# Patient Record
Sex: Female | Born: 1985 | Race: Black or African American | Hispanic: No | Marital: Single | Smoking: Never smoker
Health system: Southern US, Community
[De-identification: ages and names within clinical notes are randomized; demographics above are authoritative.]

## PROBLEM LIST (undated history)

## (undated) ENCOUNTER — Inpatient Hospital Stay (HOSPITAL_COMMUNITY): Payer: Self-pay

## (undated) DIAGNOSIS — O09899 Supervision of other high risk pregnancies, unspecified trimester: Secondary | ICD-10-CM

## (undated) DIAGNOSIS — A749 Chlamydial infection, unspecified: Secondary | ICD-10-CM

## (undated) DIAGNOSIS — N390 Urinary tract infection, site not specified: Secondary | ICD-10-CM

## (undated) DIAGNOSIS — O09219 Supervision of pregnancy with history of pre-term labor, unspecified trimester: Secondary | ICD-10-CM

## (undated) DIAGNOSIS — D649 Anemia, unspecified: Secondary | ICD-10-CM

## (undated) HISTORY — DX: Urinary tract infection, site not specified: N39.0

## (undated) HISTORY — DX: Supervision of other high risk pregnancies, unspecified trimester: O09.899

## (undated) HISTORY — DX: Chlamydial infection, unspecified: A74.9

## (undated) HISTORY — DX: Supervision of pregnancy with history of pre-term labor, unspecified trimester: O09.219

---

## 2011-04-16 ENCOUNTER — Other Ambulatory Visit: Payer: Self-pay

## 2011-04-16 ENCOUNTER — Encounter (HOSPITAL_COMMUNITY): Payer: Self-pay | Admitting: *Deleted

## 2011-04-16 ENCOUNTER — Inpatient Hospital Stay (HOSPITAL_COMMUNITY)
Admission: AD | Admit: 2011-04-16 | Discharge: 2011-04-16 | Disposition: A | Discharge status: Home / Self Care | Payer: Medicaid Other | Source: Ambulatory Visit | Attending: Obstetrics & Gynecology | Admitting: Obstetrics & Gynecology

## 2011-04-16 DIAGNOSIS — O2650 Maternal hypotension syndrome, unspecified trimester: Secondary | ICD-10-CM | POA: Insufficient documentation

## 2011-04-16 DIAGNOSIS — D649 Anemia, unspecified: Secondary | ICD-10-CM | POA: Insufficient documentation

## 2011-04-16 DIAGNOSIS — O99019 Anemia complicating pregnancy, unspecified trimester: Secondary | ICD-10-CM | POA: Insufficient documentation

## 2011-04-16 DIAGNOSIS — R55 Syncope and collapse: Secondary | ICD-10-CM

## 2011-04-16 HISTORY — DX: Anemia, unspecified: D64.9

## 2011-04-16 LAB — URINALYSIS, ROUTINE W REFLEX MICROSCOPIC
Glucose, UA: NEGATIVE mg/dL
Nitrite: NEGATIVE
Specific Gravity, Urine: 1.025 (ref 1.005–1.030)
pH: 7 (ref 5.0–8.0)

## 2011-04-16 LAB — URINE MICROSCOPIC-ADD ON

## 2011-04-16 LAB — CBC
MCH: 29 pg (ref 26.0–34.0)
MCHC: 32.8 g/dL (ref 30.0–36.0)
Platelets: 161 10*3/uL (ref 150–400)
RDW: 14 % (ref 11.5–15.5)

## 2011-04-16 LAB — COMPREHENSIVE METABOLIC PANEL
ALT: 8 U/L (ref 0–35)
Albumin: 3.1 g/dL — ABNORMAL LOW (ref 3.5–5.2)
Calcium: 9.5 mg/dL (ref 8.4–10.5)
GFR calc Af Amer: >90 mL/min (ref >90)
Glucose, Bld: 73 mg/dL (ref 70–99)
Sodium: 131 mEq/L — ABNORMAL LOW (ref 135–145)
Total Protein: 7.1 g/dL (ref 6.0–8.3)

## 2011-04-16 MED ORDER — ACETAMINOPHEN 325 MG PO TABS
650.0000 mg | ORAL_TABLET | Freq: Once | ORAL | Status: AC
  Administered 2011-04-16: 650 mg via ORAL
  Filled 2011-04-16: qty 2

## 2011-04-16 NOTE — Progress Notes (Signed)
Pt brought in by EMS. Pt states she was at work today and passed out witnessed by her Production designer, theatre/television/film. Pt unsure if she hit her head or not. No c/o pain at this time. No PNC.

## 2011-04-16 NOTE — ED Provider Notes (Signed)
History     Chief Complaint  Patient presents with  . Loss of Consciousness   HPI  Pt is pregnant ?[redacted]w[redacted]d pregnant with LMP 12/19/2010.  She saw [facility] for pregnancy verification 02/20/2011- pt is waiting for Medicaid.  She was working at [employer] @ 11 am and at 12 noon, pt felt dizzy and went to the back of Estate agent found pt on the floor and picked pt up- not sure how long she had been unconscious- pt is not sure if she hit her head- frontal/left orbital.  She was brought in by EMS.  She denies any spotting or bleeding or cramping.  Pt states that she has a history of syncope episodes in high school- no noted cause  Past Medical History  Diagnosis Date  . Anemia     Past Surgical History  Procedure Date  . Cesarean section     No family history on file.  History  Substance Use Topics  . Smoking status: Never Smoker   . Smokeless tobacco: Not on file  . Alcohol Use: No    Allergies: Allergies not on file  No prescriptions prior to admission    Review of Systems  Constitutional: Negative for fever and chills.  Eyes: Negative for blurred vision.  Gastrointestinal: Negative for nausea, vomiting, abdominal pain, diarrhea and constipation.  Genitourinary: Negative for dysuria.  Neurological: Positive for dizziness. Negative for tingling and focal weakness.   Physical Exam   Blood pressure 103/61, pulse 100, temperature 97.1 F (36.2 C), temperature source Oral, resp. rate 20, height 5\' 4"  (1.626 m), weight 100 lb (45.36 kg), last menstrual period 12/19/2010.  Physical Exam  Vitals reviewed. Constitutional: She is oriented to person, place, and time. She appears well-developed and well-nourished. No distress.  Eyes: Pupils are equal, round, and reactive to light.       Small edematous closed tender area over left eye- ice applied  Neck: Normal range of motion. Neck supple.  Cardiovascular: Normal rate.   Respiratory: Effort normal.  GI: Soft.    Musculoskeletal: Normal range of motion.  Neurological: She is alert and oriented to person, place, and time.  Skin: Skin is warm and dry.  Psychiatric: She has a normal mood and affect.    MAU Course  Procedures CBC CMET Urinalysis EKG    Assessment and Plan  Syncope episode in pregnancy- discussed eating small frequent meals and staying well hydrated- avoiding sweets- getting up slowly and not standing still for long periods of time Anemia- pt to continue prenatal vitamins and to add supplemental iron F/u with OB care at [facility] 04/16/2011, 1:48 PM

## 2011-04-18 NOTE — ED Provider Notes (Signed)
Attestation of Attending Supervision of Advanced Practitioner: Evaluation and management procedures were performed by the PA/NP/CNM/OB Fellow under my supervision/collaboration. Chart reviewed and agree with management and plan.  Zakaiya Lares A 04/18/2011 2:47 PM   

## 2011-06-10 ENCOUNTER — Inpatient Hospital Stay (HOSPITAL_COMMUNITY): Payer: Medicaid Other

## 2011-06-10 ENCOUNTER — Encounter (HOSPITAL_COMMUNITY): Payer: Self-pay | Admitting: *Deleted

## 2011-06-10 ENCOUNTER — Inpatient Hospital Stay (HOSPITAL_COMMUNITY)
Admission: AD | Admit: 2011-06-10 | Discharge: 2011-06-12 | DRG: 781 | Disposition: A | Discharge status: Home / Self Care | Payer: Medicaid Other | Source: Ambulatory Visit | Attending: Obstetrics & Gynecology | Admitting: Obstetrics & Gynecology

## 2011-06-10 DIAGNOSIS — O34219 Maternal care for unspecified type scar from previous cesarean delivery: Secondary | ICD-10-CM | POA: Diagnosis present

## 2011-06-10 DIAGNOSIS — N760 Acute vaginitis: Secondary | ICD-10-CM | POA: Diagnosis present

## 2011-06-10 DIAGNOSIS — O0930 Supervision of pregnancy with insufficient antenatal care, unspecified trimester: Secondary | ICD-10-CM

## 2011-06-10 DIAGNOSIS — B9689 Other specified bacterial agents as the cause of diseases classified elsewhere: Secondary | ICD-10-CM | POA: Diagnosis present

## 2011-06-10 DIAGNOSIS — O26879 Cervical shortening, unspecified trimester: Principal | ICD-10-CM | POA: Diagnosis present

## 2011-06-10 DIAGNOSIS — O2390 Unspecified genitourinary tract infection in pregnancy, unspecified trimester: Secondary | ICD-10-CM | POA: Diagnosis present

## 2011-06-10 DIAGNOSIS — O479 False labor, unspecified: Secondary | ICD-10-CM

## 2011-06-10 DIAGNOSIS — A499 Bacterial infection, unspecified: Secondary | ICD-10-CM | POA: Diagnosis present

## 2011-06-10 LAB — URINALYSIS, ROUTINE W REFLEX MICROSCOPIC
Bilirubin Urine: NEGATIVE
Glucose, UA: NEGATIVE mg/dL
Nitrite: NEGATIVE
Specific Gravity, Urine: <1.005 — ABNORMAL LOW (ref 1.005–1.030)
pH: 6.5 (ref 5.0–8.0)

## 2011-06-10 LAB — URINE MICROSCOPIC-ADD ON

## 2011-06-10 MED ORDER — MAGNESIUM SULFATE 40 G IN LACTATED RINGERS - SIMPLE
2.0000 g/h | INTRAVENOUS | Status: AC
  Filled 2011-06-10: qty 500

## 2011-06-10 MED ORDER — DOCUSATE SODIUM 100 MG PO CAPS
100.0000 mg | ORAL_CAPSULE | Freq: Every day | ORAL | Status: DC
  Administered 2011-06-10 – 2011-06-11 (×2): 100 mg via ORAL
  Filled 2011-06-10 (×2): qty 1

## 2011-06-10 MED ORDER — LACTATED RINGERS IV SOLN: INTRAVENOUS | Status: DC

## 2011-06-10 MED ORDER — MAGNESIUM SULFATE BOLUS VIA INFUSION
4.0000 g | Freq: Once | INTRAVENOUS | Status: AC
  Administered 2011-06-10: 4 g via INTRAVENOUS
  Filled 2011-06-10: qty 500

## 2011-06-10 MED ORDER — LACTATED RINGERS IV SOLN
INTRAVENOUS | Status: DC
  Administered 2011-06-10 – 2011-06-11 (×4): via INTRAVENOUS

## 2011-06-10 MED ORDER — ZOLPIDEM TARTRATE 10 MG PO TABS: 10.0000 mg | ORAL_TABLET | Freq: Every evening | ORAL | Status: DC | PRN

## 2011-06-10 MED ORDER — ACETAMINOPHEN 325 MG PO TABS
650.0000 mg | ORAL_TABLET | ORAL | Status: DC | PRN
  Administered 2011-06-11: 650 mg via ORAL
  Filled 2011-06-10: qty 2

## 2011-06-10 MED ORDER — PRENATAL PLUS 27-1 MG PO TABS
1.0000 | ORAL_TABLET | Freq: Every day | ORAL | Status: DC
  Administered 2011-06-11: 1 via ORAL
  Filled 2011-06-10 (×2): qty 1

## 2011-06-10 MED ORDER — CALCIUM CARBONATE ANTACID 500 MG PO CHEW: 2.0000 | CHEWABLE_TABLET | ORAL | Status: DC | PRN

## 2011-06-10 MED ORDER — METRONIDAZOLE 500 MG PO TABS
500.0000 mg | ORAL_TABLET | Freq: Two times a day (BID) | ORAL | Status: DC
  Administered 2011-06-10 – 2011-06-11 (×3): 500 mg via ORAL
  Filled 2011-06-10 (×4): qty 1

## 2011-06-10 MED ORDER — BETAMETHASONE SOD PHOS & ACET 6 (3-3) MG/ML IJ SUSP
12.0000 mg | INTRAMUSCULAR | Status: AC
  Administered 2011-06-10 – 2011-06-11 (×2): 12 mg via INTRAMUSCULAR
  Filled 2011-06-10 (×2): qty 2

## 2011-06-10 MED ORDER — LACTATED RINGERS IV BOLUS (SEPSIS)
500.0000 mL | Freq: Once | INTRAVENOUS | Status: AC
  Administered 2011-06-10: 500 mL via INTRAVENOUS

## 2011-06-10 MED ORDER — PROGESTERONE MICRONIZED 200 MG PO CAPS
200.0000 mg | ORAL_CAPSULE | Freq: Every day | ORAL | Status: DC
  Administered 2011-06-10 – 2011-06-11 (×2): 200 mg via VAGINAL
  Filled 2011-06-10 (×2): qty 1

## 2011-06-10 NOTE — ED Provider Notes (Signed)
History     Chief Complaint  Patient presents with  . Laboring   HPI Tami Thomas 25 y.o. female  G85P0101 at [redacted]w[redacted]d by LMP 12/17/10 presenting after being seen by [facility] department today and being found to have a dilated cervix on exam.Patient has no complaints today. Patient denies contractions/decreased fetal movement/discharge/blood from vagina/rush of fluid.   Patient states that her first pregnancy she had very late care and went into labor at 32.4. Her child was born at a weight of approximately 3 lbs 10 oz by caesarean section at [facility] in [place]-patient reports that her anatomy was not conducive for a delivery. She was told that her next pregnancy would have to be by caesarean section. Child stayed in the NICU approximately a month. Patient states that she had a positive pregnancy test back in august after having her last menstrual period in mid-June. She attempted to establish care at the HD in [place], but was transferred to care at [facility] Department. She went in for her first visit today. She had blood work, pap smear, glucola, and cultures. Results not immediately available.   Records from [facility]: Preterm labor with nonreassuring fetal surveillance and inadequate pelvis requiring PLTCS with Vertical extension.    Records from [facility] (to be scanned as well as records from [facility]): 1 hr GTT 67 H/H 9.6/29.3 Wet prep: few clue cells, wbc modeerate, yeast neg.  UA negative protein and glucose.  Moderate amount of yellow discharge. PH>4. Diagnosed as cervicitis. Given dose of azithromycin and told to abstain until genital probe complete.    OB History    Grav Para Term Preterm Abortions TAB SAB Ect Mult Living   2 1  1      1       Past Medical History  Diagnosis Date  . Anemia     Past Surgical History  Procedure Date  . Cesarean section     No family history on file.  History  Substance Use Topics  . Smoking status: Never Smoker     . Smokeless tobacco: Not on file  . Alcohol Use: No    Allergies: No Known Allergies  Prescriptions prior to admission  Medication Sig Dispense Refill  . prenatal vitamin w/FE, FA (PRENATAL 1 + 1) 27-1 MG TABS Take 1 tablet by mouth daily.          ROS negative except as noted in HPI   Physical Exam   Blood pressure 112/73, pulse 81, temperature 98.9 F (37.2 C), resp. rate 18, last menstrual period 12/17/2010.  Physical Exam  Constitutional: She is oriented to person, place, and time. She appears well-nourished. No distress.  Cardiovascular: Normal rate and regular rhythm.  Exam reveals no gallop and no friction rub.   No murmur heard. Respiratory: Breath sounds normal. No respiratory distress. She has no wheezes. She has no rales.  GI:       Gravid. Size consistent with dates of approximately 26-28 weeks.   Genitourinary: Vagina normal.  Musculoskeletal: She exhibits no edema.  Neurological: She is alert and oriented to person, place, and time.   Dilation: 1.5 Effacement (%): 20 Cervical Position: Posterior Station: Ballotable Exam by:: D. Poe CNM  FHT-140 baseline. 1 decel noted-isolated severe variable with spontaneous recovery. Accels 10x10. Moderate reactivity.  Occasional contractions noted on top of irritability  MAU Course  Procedures  MDM -anatomy u/s showing 1.3 cm cervix but otherwise unremarkable. Cephalic presentation.   -due to shortened cervix-will  admit patient (see below) -hydrated with 500 cc bolus in ED without decreased irritability-will need to admit and treat with magnesium/Prometrium due to occasional contractions.   Assessment and Plan  #1 G2P0101 at [redacted]w[redacted]d #2 Insufficient prenatal care  #3 Shortened Cervix 1.3 cm #4 Category II tracing #5 Previous c-section during previous preterm delivery PLTCS with VERTICAL EXTENSION #6 Bacterial Vaginosis  Admit to inpatient. Magnesium x12 hours. Then Prometrium 200mg  qhs vaginally. Betamethasone x2  doses.  Continue IV fluid Continuous toco and FHT tracing.  Patient will require repeat c-section.  Will treat with metronidazole 500mg  BID x7 days for BV.  Will obtain urinalysis to see if other source of infection present.   Case Discussed with Caren Griffins, CNM.   Tana Conch 06/10/2011, 3:41 PM

## 2011-06-10 NOTE — H&P (Signed)
  See MAU note for H&P  Tana Conch 6:21 PM 06/10/2011

## 2011-06-10 NOTE — Progress Notes (Signed)
Pt went to [facility] Dept today for 1st visit, due to pt's cx exam and previous PTD, they told her to come to [facility], records requested, blood work done at  [facility] clinic

## 2011-06-11 NOTE — H&P (Signed)
Attestation of Attending Supervision of Advanced Practitioner: Evaluation and management procedures were performed by the PA/NP/CNM/OB Fellow under my supervision/collaboration. Chart reviewed and agree with management and plan.  Osualdo Hansell V 06/11/2011 3:26 AM

## 2011-06-11 NOTE — ED Provider Notes (Signed)
Attestation of Attending Supervision of Advanced Practitioner: Evaluation and management procedures were performed by the PA/NP/CNM/OB Fellow under my supervision/collaboration. Chart reviewed and agree with management and plan.  Madina Galati V 06/11/2011 3:26 AM    

## 2011-06-11 NOTE — Progress Notes (Signed)
Patient ID: Tami Thomas, female   DOB: May 03, 1986, 25 y.o.   MRN: [id number] FACULTY PRACTICE ANTEPARTUM(COMPREHENSIVE) NOTE  Tami Thomas is a 25 y.o. G2P0101 at [redacted]w[redacted]d who is admitted for shortened cx.   Fetal presentation is cephalic.   Subjective: Feels well; no complaints. Patient reports the fetal movement as active. Patient reports uterine contraction  activity as none. Patient reports  vaginal bleeding as none. Patient describes fluid per vagina as None.  Vitals:  Blood pressure 100/55, pulse 81, temperature 98.2 F (36.8 C), temperature source Oral, resp. rate 18, height 5\' 4"  (1.626 m), weight 49.896 kg (110 lb), last menstrual period 12/17/2010. Physical Examination:  General appearance - alert, well appearing, and in no distress and oriented to person, place, and time Pelvic Exam:  examination not indicated Extremities: extremities normal, atraumatic, no cyanosis or edema  Membranes:intact  Fetal Monitoring:  Baseline: 130 bpm reactive and reassuring; no ctx per [employer]:  Recent Results (from the past 24 hour(s))  URINALYSIS, ROUTINE W REFLEX MICROSCOPIC   Collection Time   06/10/11  6:20 PM      Component Value Range   Color, Urine STRAW (*) YELLOW    APPearance CLEAR  CLEAR    Specific Gravity, Urine <1.005 (*) 1.005 - 1.030    pH 6.5  5.0 - 8.0    Glucose, UA NEGATIVE  NEGATIVE (mg/dL)   Hgb urine dipstick SMALL (*) NEGATIVE    Bilirubin Urine NEGATIVE  NEGATIVE    Ketones, ur 15 (*) NEGATIVE (mg/dL)   Protein, ur NEGATIVE  NEGATIVE (mg/dL)   Urobilinogen, UA 0.2  0.0 - 1.0 (mg/dL)   Nitrite NEGATIVE  NEGATIVE    Leukocytes, UA LARGE (*) NEGATIVE   URINE MICROSCOPIC-ADD ON   Collection Time   06/10/11  6:20 PM      Component Value Range   Squamous Epithelial / LPF RARE  RARE    WBC, UA 11-20  <3 (WBC/hpf)   RBC / HPF 3-6  <3 (RBC/hpf)   Bacteria, UA MANY (*) RARE      Medications:  Scheduled    . betamethasone acetate-betamethasone sodium  phosphate  12 mg Intramuscular Q24H  . docusate sodium  100 mg Oral Daily  . lactated ringers  500 mL Intravenous Once  . magnesium  4 g Intravenous Once  . metroNIDAZOLE  500 mg Oral Q12H  . prenatal vitamin w/FE, FA  1 tablet Oral Daily  . progesterone  200 mg Vaginal QHS   I have reviewed the patient's current medications.  ASSESSMENT: IUP at 25.1wks Incidental finding of shortened cx Prev classical C/S  PLAN: Magnesium to be d/ced as has been receiving for at least 12 hrs Second Bmeth inj to be given at approx 7pm tonight Urine to culture May consider d/c home in the morning if remains stable, and plan for prenatal care at [facility].  SHAW, KIMBERLY 06/11/2011,9:25 AM

## 2011-06-12 DIAGNOSIS — O0930 Supervision of pregnancy with insufficient antenatal care, unspecified trimester: Secondary | ICD-10-CM

## 2011-06-12 DIAGNOSIS — O26879 Cervical shortening, unspecified trimester: Principal | ICD-10-CM

## 2011-06-12 DIAGNOSIS — O479 False labor, unspecified: Secondary | ICD-10-CM

## 2011-06-12 LAB — URINE CULTURE
Culture  Setup Time: 201212120108
Culture: NO GROWTH
Special Requests: NORMAL

## 2011-06-12 MED ORDER — PROGESTERONE MICRONIZED 200 MG PO CAPS: 200.0000 mg | ORAL_CAPSULE | Freq: Every day | ORAL | Status: DC

## 2011-06-12 MED ORDER — METRONIDAZOLE 500 MG PO TABS: 500.0000 mg | ORAL_TABLET | Freq: Two times a day (BID) | ORAL | Status: DC

## 2011-06-12 MED ORDER — METRONIDAZOLE 500 MG PO TABS: 500.0000 mg | ORAL_TABLET | Freq: Two times a day (BID) | ORAL | Status: AC

## 2011-06-12 NOTE — Discharge Summary (Signed)
Physician Discharge Summary  Patient ID: Tami Thomas MRN: [id number] DOB/AGE: 01-25-1986 25 y.o.  Admit date: 06/10/2011 Discharge date: 06/12/2011  Admission Diagnoses: IUP at 25wks Late to care  Shortened cx BV  Discharge Diagnoses:  Same  Discharged Condition: good  Hospital Course: [name] is a 25yo G44P0101 who presented at 25wks from [facility] for further eval of her cervix. She has a hx of a preterm delivery via classical CS which appears to have been done for CPD. She reports having bloodwork drawn at the HD. On [place] at [facility] pt was found to have a cx of thickness 1.3cm (digital exam 1.5cm/long/thick) and ceph presentation and she admitted to Antenatal where she received 12 hrs of Magnesium for neuro prophylaxis as well as the Betamethasone series. She denies any pain, bldg or leaking currently. FHR is reactive and reassuring and there are no ctx per toco. She has been deemed to have received the full benefit of her hospital stay and she will be discharged home.  Consults: none   Treatments: IV hydration, antibiotics: metronidazole and steroids: Betamethasone  Discharge Exam: Blood pressure 94/52, pulse 85, temperature 98.8 F (37.1 C), temperature source Oral, resp. rate 18, height 5\' 4"  (1.626 m), weight 49.896 kg (110 lb), last menstrual period 12/17/2010. General appearance: alert, cooperative and no distress  Disposition: Home or Self Care  Discharge Orders    Future Appointments: Provider: Department: Dept Phone: Center:   06/20/2011 8:45 AM [facility] [phone] [facility]     Current Discharge Medication List    START taking these medications   Details  metroNIDAZOLE (FLAGYL) 500 MG tablet Take 1 tablet (500 mg total) by mouth every 12 (twelve) hours. Qty: 12 tablet, Refills: 0    progesterone (PROMETRIUM) 200 MG capsule Take 1 capsule (200 mg total) by mouth at bedtime. Qty: 60 capsule, Refills: 1      CONTINUE these  medications which have NOT CHANGED   Details  prenatal vitamin w/FE, FA (PRENATAL 1 + 1) 27-1 MG TABS Take 1 tablet by mouth daily.         Follow-up Information    Follow up with [facility]. (Appointment scheduled for 12/20 according  to patient)    Contact information:   [address] [place] [address]          Signed: Cam Hai 06/12/2011, 8:02 AM

## 2011-06-18 NOTE — Progress Notes (Unsigned)
Pt received flu vaccine at health dept on 06/10/11. Also has already had her 1 hr gtt on 06/10/11

## 2011-06-20 ENCOUNTER — Ambulatory Visit (INDEPENDENT_AMBULATORY_CARE_PROVIDER_SITE_OTHER): Payer: Medicaid Other | Admitting: Obstetrics & Gynecology

## 2011-06-20 DIAGNOSIS — Z9889 Other specified postprocedural states: Secondary | ICD-10-CM

## 2011-06-20 DIAGNOSIS — Z98891 History of uterine scar from previous surgery: Secondary | ICD-10-CM

## 2011-06-20 DIAGNOSIS — O09219 Supervision of pregnancy with history of pre-term labor, unspecified trimester: Secondary | ICD-10-CM

## 2011-06-20 LAB — POCT URINALYSIS DIP (DEVICE)
Hgb urine dipstick: NEGATIVE
Ketones, ur: NEGATIVE mg/dL
Protein, ur: NEGATIVE mg/dL
Specific Gravity, Urine: 1.025 (ref 1.005–1.030)
Urobilinogen, UA: 0.2 mg/dL (ref 0.0–1.0)
pH: 7 (ref 5.0–8.0)

## 2011-06-20 NOTE — Progress Notes (Signed)
C/O lightheadedness onset today.

## 2011-06-20 NOTE — Patient Instructions (Signed)
Breastfeeding BENEFITS OF BREASTFEEDING For the baby  The first milk (colostrum) helps the baby's digestive system function better.   There are antibodies from the mother in the milk that help the baby fight off infections.   The baby has a lower incidence of asthma, allergies, and SIDS (sudden infant death syndrome).   The nutrients in breast milk are better than formulas for the baby and helps the baby's brain grow better.   Babies who breastfeed have less gas, colic, and constipation.  For the mother  Breastfeeding helps develop a very special bond between mother and baby.   It is more convenient, always available at the correct temperature and cheaper than formula feeding.   It burns calories in the mother and helps with losing weight that was gained during pregnancy.   It makes the uterus contract back down to normal size faster and slows bleeding following delivery.   Breastfeeding mothers have a lower risk of developing breast cancer.  NURSE FREQUENTLY  A healthy, full-term baby may breastfeed as often as every hour or space his or her feedings to every 3 hours.   How often to nurse will vary from baby to baby. Watch your baby for signs of hunger, not the clock.   Nurse as often as the baby requests, or when you feel the need to reduce the fullness of your breasts.   Awaken the baby if it has been 3 to 4 hours since the last feeding.   Frequent feeding will help the mother make more milk and will prevent problems like sore nipples and engorgement of the breasts.  BABY'S POSITION AT THE BREAST  Whether lying down or sitting, be sure that the baby's tummy is facing your tummy.   Support the breast with 4 fingers underneath the breast and the thumb above. Make sure your fingers are well away from the nipple and baby's mouth.   Stroke the baby's lips and cheek closest to the breast gently with your finger or nipple.   When the baby's mouth is open wide enough, place  all of your nipple and as much of the dark area around the nipple as possible into your baby's mouth.   Pull the baby in close so the tip of the nose and the baby's cheeks touch the breast during the feeding.  FEEDINGS  The length of each feeding varies from baby to baby and from feeding to feeding.   The baby must suck about 2 to 3 minutes for your milk to get to him or her. This is called a "let down." For this reason, allow the baby to feed on each breast as long as he or she wants. Your baby will end the feeding when he or she has received the right balance of nutrients.   To break the suction, put your finger into the corner of the baby's mouth and slide it between his or her gums before removing your breast from his or her mouth. This will help prevent sore nipples.  REDUCING BREAST ENGORGEMENT  In the first week after your baby is born, you may experience signs of breast engorgement. When breasts are engorged, they feel heavy, warm, full, and may be tender to the touch. You can reduce engorgement if you:   Nurse frequently, every 2 to 3 hours. Mothers who breastfeed early and often have fewer problems with engorgement.   Place light ice packs on your breasts between feedings. This reduces swelling. Wrap the ice packs in a   lightweight towel to protect your skin.   Apply moist hot packs to your breast for 5 to 10 minutes before each feeding. This increases circulation and helps the milk flow.   Gently massage your breast before and during the feeding.   Make sure that the baby empties at least one breast at every feeding before switching sides.   Use a breast pump to empty the breasts if your baby is sleepy or not nursing well. You may also want to pump if you are returning to work or or you feel you are getting engorged.   Avoid bottle feeds, pacifiers or supplemental feedings of water or juice in place of breastfeeding.   Be sure the baby is latched on and positioned properly while  breastfeeding.   Prevent fatigue, stress, and anemia.   Wear a supportive bra, avoiding underwire styles.   Eat a balanced diet with enough fluids.  If you follow these suggestions, your engorgement should improve in 24 to 48 hours. If you are still experiencing difficulty, call your lactation consultant or caregiver. IS MY BABY GETTING ENOUGH MILK? Sometimes, mothers worry about whether their babies are getting enough milk. You can be assured that your baby is getting enough milk if:  The baby is actively sucking and you hear swallowing.   The baby nurses at least 8 to 12 times in a 24 hour time period. Nurse your baby until he or she unlatches or falls asleep at the first breast (at least 10 to 20 minutes), then offer the second side.   The baby is wetting 5 to 6 disposable diapers (6 to 8 cloth diapers) in a 24 hour period by 5 to 6 days of age.   The baby is having at least 2 to 3 stools every 24 hours for the first few months. Breast milk is all the food your baby needs. It is not necessary for your baby to have water or formula. In fact, to help your breasts make more milk, it is best not to give your baby supplemental feedings during the early weeks.   The stool should be soft and yellow.   The baby should gain 4 to 7 ounces per week after he is 4 days old.  TAKE CARE OF YOURSELF Take care of your breasts by:  Bathing or showering daily.   Avoiding the use of soaps on your nipples.   Start feedings on your left breast at one feeding and on your right breast at the next feeding.   You will notice an increase in your milk supply 2 to 5 days after delivery. You may feel some discomfort from engorgement, which makes your breasts very firm and often tender. Engorgement "peaks" out within 24 to 48 hours. In the meantime, apply warm moist towels to your breasts for 5 to 10 minutes before feeding. Gentle massage and expression of some milk before feeding will soften your breasts, making  it easier for your baby to latch on. Wear a well fitting nursing bra and air dry your nipples for 10 to 15 minutes after each feeding.   Only use cotton bra pads.   Only use pure lanolin on your nipples after nursing. You do not need to wash it off before nursing.  Take care of yourself by:   Eating well-balanced meals and nutritious snacks.   Drinking milk, fruit juice, and water to satisfy your thirst (about 8 glasses a day).   Getting plenty of rest.   Increasing calcium in   your diet (1200 mg a day).   Avoiding foods that you notice affect the baby in a bad way.  SEEK MEDICAL CARE IF:   You have any questions or difficulty with breastfeeding.   You need help.   You have a hard, red, sore area on your breast, accompanied by a fever of 100.5 F (38.1 C) or more.   Your baby is too sleepy to eat well or is having trouble sleeping.   Your baby is wetting less than 6 diapers per day, by 5 days of age.   Your baby's skin or white part of his or her eyes is more yellow than it was in the hospital.   You feel depressed.  Document Released: 06/17/2005 Document Revised: 02/27/2011 Document Reviewed: 01/30/2009 [employer] Patient Information 2012 [employer]. 

## 2011-06-20 NOTE — Progress Notes (Signed)
Addended by: Adam Phenix on: 06/20/2011 10:37 AM   Modules accepted: Orders

## 2011-06-20 NOTE — Progress Notes (Signed)
Admitted with cervical change, 1.3 cm long on [place], sent home on 06/11/11, s/p betamethasone, on Prometrium per vaginal. No contractions. Preious CS, wants repeat, BTL. Encouraged to breastfeed.

## 2011-06-20 NOTE — Progress Notes (Signed)
Nutrition Note:  (Referral for 1st visit) Pt dx. Underweight, hx of PTD, hx of anemia. Pt has a current wt gain of 10.8# @ [redacted]w[redacted]d gestation.  Wt is plotting 3#< expected.  Pt reports excellent intake, eating q 2-3 hours, no food allergies, no N/V. Pt is taking PNV, but no iron supplement.  Disc wt gain goals of 28-40# due to underweight status prepregnancy. Pt agrees to continue to eat small, frequent meals.  Pt has missed 2 [facility] appointment and will call to reschedule.  Follow up in 4 weeks.  Cy Blamer, RD

## 2011-07-04 ENCOUNTER — Encounter: Payer: Self-pay | Admitting: Obstetrics & Gynecology

## 2011-07-04 ENCOUNTER — Ambulatory Visit (INDEPENDENT_AMBULATORY_CARE_PROVIDER_SITE_OTHER): Payer: Medicaid Other | Admitting: Obstetrics & Gynecology

## 2011-07-04 DIAGNOSIS — O09219 Supervision of pregnancy with history of pre-term labor, unspecified trimester: Secondary | ICD-10-CM

## 2011-07-04 DIAGNOSIS — O0990 Supervision of high risk pregnancy, unspecified, unspecified trimester: Secondary | ICD-10-CM | POA: Insufficient documentation

## 2011-07-04 LAB — POCT URINALYSIS DIP (DEVICE)
Hgb urine dipstick: NEGATIVE
Ketones, ur: >=160 mg/dL — AB
Protein, ur: 30 mg/dL — AB
pH: 5.5 (ref 5.0–8.0)

## 2011-07-04 NOTE — Assessment & Plan Note (Signed)
Nml anatomy (short cervix on Dec 10th), GCT = 67 on 06/10/11

## 2011-07-04 NOTE — Progress Notes (Signed)
Pt not having any pressure or contractions or change in discharge.  Cervix not been checked since admission.  Cx today 1-2/70, -2.    FFN sent stat.  Pt to go home on bed rest and vag rest.  Will check cervix tomorrow morning.  If any change, will admit.  Pt given strict instructions on preterm labor and when to return to the hospital.  Pt lives close and can be here in 5 mins.  Pt to breast feed and wants BTL.

## 2011-07-04 NOTE — Patient Instructions (Signed)
Sterilization, Women Sterilization is a surgical procedure. This surgery permanently prevents pregnancy in women. This can be done by tying (with or without cutting) the fallopian tubes or burning the tubes closed (tubal ligation). Tubal ligation blocks the tubes and prevents the egg from being fertilized by the sperm. Sterilization can be done by removing the ovaries that produce the egg (castration) as well. Sterilization is considered safe with very rare complications. It does not affect menstrual periods, sexual desire, or performance.  Since sterilization is considered permanent, you should not do it until you are sure you do not want to have more children. You and your partner should fully agree to have the procedure. Your decision to have the procedure should not be made when you are in a stressful situation. This can include a loss of a pregnancy, illness or death of a spouse, or divorce. There are other means of preventing unwanted pregnancies that can be used until you are completely sure you want to be sterilized. Sterilization does not protect against sexually transmitted disease. Women who had a sterilization procedure and want it reversed must know that it requires an expensive and major operation. The reversal may not be successful and has a high rate of tubal (ectopic) pregnancy that can be dangerous and require surgery. There are several ways to perform a tubal sterlization:  Laparoscopy. The abdomen is filled with a gas to see the pelvic organs. Then, a tube with a light attached is inserted into the abdomen through 2 small incisions. The fallopian tubes are blocked with a ring, clip or electrocautery to burn closed the tubes. Then, the gas is released and the small incisions are closed.   Hysteroscopy. A tube with a light is inserted in the vagina, through the cervix and then into the uterus. A spring-like instrument is inserted into the opening of the fallopian tubes. The spring causes  scaring and blocks the tubes. Other forms of contraception should be used for three months at which time an X-ray is done to be sure the tubes are blocked.   Minilaparotomy. This is done right after giving birth. A small incision is made under the belly button and the tubes are exposed. The tubes can then be burned, tied and/or cut.   Tubal ligation can be done during a Cesarean section.   Castration is a surgical procedure that removes both ovaries.  Tubal sterilization should be discussed with your caregiver to answer any concerns you or your partner might have. This meeting will help to decide for sure if the operation is safe for you and which procedure is the best one for you. You can change your mind and cancel the surgery at any time. HOME CARE INSTRUCTIONS   Follow your caregivers instructions regarding diet, rest, work, social and sexual activities and follow up appointments.   Shoulder pain is common following a laparoscopy. The pain may be relieved by lying down flat.   Only take over-the-counter or prescription medicines for pain, discomfort or fever as directed by your caregiver.   You may use lozenges for throat discomfort.   Keep the incisions covered to prevent infection.  SEEK IMMEDIATE MEDICAL CARE IF:   You develop a temperature of 102 F (38.9 C), or as your caregiver suggests.   You become dizzy or faint.   You start to feel sick to your stomach (nausea) or throw up (vomit).   You develop abdominal pain not relieved with over-the-counter medications.   You have redness and puffiness (  swelling) of the cut (incision).   You see pus draining from the incision.   You miss a menstrual period.  Document Released: 12/04/2007 Document Revised: 02/27/2011 Document Reviewed: 12/04/2007 [facility] Patient Information 2012 [place], [place].Breastfeeding BENEFITS OF BREASTFEEDING For the baby  The first milk (colostrum) helps the baby's digestive system function better.     There are antibodies from the mother in the milk that help the baby fight off infections.   The baby has a lower incidence of asthma, allergies, and SIDS (sudden infant death syndrome).   The nutrients in breast milk are better than formulas for the baby and helps the baby's brain grow better.   Babies who breastfeed have less gas, colic, and constipation.  For the mother  Breastfeeding helps develop a very special bond between mother and baby.   It is more convenient, always available at the correct temperature and cheaper than formula feeding.   It burns calories in the mother and helps with losing weight that was gained during pregnancy.   It makes the uterus contract back down to normal size faster and slows bleeding following delivery.   Breastfeeding mothers have a lower risk of developing breast cancer.  NURSE FREQUENTLY  A healthy, full-term baby may breastfeed as often as every hour or space his or her feedings to every 3 hours.   How often to nurse will vary from baby to baby. Watch your baby for signs of hunger, not the clock.   Nurse as often as the baby requests, or when you feel the need to reduce the fullness of your breasts.   Awaken the baby if it has been 3 to 4 hours since the last feeding.   Frequent feeding will help the mother make more milk and will prevent problems like sore nipples and engorgement of the breasts.  BABY'S POSITION AT THE BREAST  Whether lying down or sitting, be sure that the baby's tummy is facing your tummy.   Support the breast with 4 fingers underneath the breast and the thumb above. Make sure your fingers are well away from the nipple and baby's mouth.   Stroke the baby's lips and cheek closest to the breast gently with your finger or nipple.   When the baby's mouth is open wide enough, place all of your nipple and as much of the dark area around the nipple as possible into your baby's mouth.   Pull the baby in close so the tip  of the nose and the baby's cheeks touch the breast during the feeding.  FEEDINGS  The length of each feeding varies from baby to baby and from feeding to feeding.   The baby must suck about 2 to 3 minutes for your milk to get to him or her. This is called a "let down." For this reason, allow the baby to feed on each breast as long as he or she wants. Your baby will end the feeding when he or she has received the right balance of nutrients.   To break the suction, put your finger into the corner of the baby's mouth and slide it between his or her gums before removing your breast from his or her mouth. This will help prevent sore nipples.  REDUCING BREAST ENGORGEMENT  In the first week after your baby is born, you may experience signs of breast engorgement. When breasts are engorged, they feel heavy, warm, full, and may be tender to the touch. You can reduce engorgement if you:  Nurse frequently, every 2 to 3 hours. Mothers who breastfeed early and often have fewer problems with engorgement.   Place light ice packs on your breasts between feedings. This reduces swelling. Wrap the ice packs in a lightweight towel to protect your skin.   Apply moist hot packs to your breast for 5 to 10 minutes before each feeding. This increases circulation and helps the milk flow.   Gently massage your breast before and during the feeding.   Make sure that the baby empties at least one breast at every feeding before switching sides.   Use a breast pump to empty the breasts if your baby is sleepy or not nursing well. You may also want to pump if you are returning to work or or you feel you are getting engorged.   Avoid bottle feeds, pacifiers or supplemental feedings of water or juice in place of breastfeeding.   Be sure the baby is latched on and positioned properly while breastfeeding.   Prevent fatigue, stress, and anemia.   Wear a supportive bra, avoiding underwire styles.   Eat a balanced diet with  enough fluids.  If you follow these suggestions, your engorgement should improve in 24 to 48 hours. If you are still experiencing difficulty, call your lactation consultant or caregiver. IS MY BABY GETTING ENOUGH MILK? Sometimes, mothers worry about whether their babies are getting enough milk. You can be assured that your baby is getting enough milk if:  The baby is actively sucking and you hear swallowing.   The baby nurses at least 8 to 12 times in a 24 hour time period. Nurse your baby until he or she unlatches or falls asleep at the first breast (at least 10 to 20 minutes), then offer the second side.   The baby is wetting 5 to 6 disposable diapers (6 to 8 cloth diapers) in a 24 hour period by 109 to 64 days of age.   The baby is having at least 2 to 3 stools every 24 hours for the first few months. Breast milk is all the food your baby needs. It is not necessary for your baby to have water or formula. In fact, to help your breasts make more milk, it is best not to give your baby supplemental feedings during the early weeks.   The stool should be soft and yellow.   The baby should gain 4 to 7 ounces per week after he is 89 days old.  TAKE CARE OF YOURSELF Take care of your breasts by:  Bathing or showering daily.   Avoiding the use of soaps on your nipples.   Start feedings on your left breast at one feeding and on your right breast at the next feeding.   You will notice an increase in your milk supply 2 to 5 days after delivery. You may feel some discomfort from engorgement, which makes your breasts very firm and often tender. Engorgement "peaks" out within 24 to 48 hours. In the meantime, apply warm moist towels to your breasts for 5 to 10 minutes before feeding. Gentle massage and expression of some milk before feeding will soften your breasts, making it easier for your baby to latch on. Wear a well fitting nursing bra and air dry your nipples for 10 to 15 minutes after each feeding.    Only use cotton bra pads.   Only use pure lanolin on your nipples after nursing. You do not need to wash it off before nursing.  Take care of  yourself by:   Eating well-balanced meals and nutritious snacks.   Drinking milk, fruit juice, and water to satisfy your thirst (about 8 glasses a day).   Getting plenty of rest.   Increasing calcium in your diet (1200 mg a day).   Avoiding foods that you notice affect the baby in a bad way.  SEEK MEDICAL CARE IF:   You have any questions or difficulty with breastfeeding.   You need help.   You have a hard, red, sore area on your breast, accompanied by a fever of 100.5 F (38.1 C) or more.   Your baby is too sleepy to eat well or is having trouble sleeping.   Your baby is wetting less than 6 diapers per day, by 57 days of age.   Your baby's skin or white part of his or her eyes is more yellow than it was in the hospital.   You feel depressed.  Document Released: 06/17/2005 Document Revised: 02/27/2011 Document Reviewed: 01/30/2009 [facility] Patient Information 2012 [place], [place].

## 2011-07-05 ENCOUNTER — Inpatient Hospital Stay (HOSPITAL_COMMUNITY)
Admission: AD | Admit: 2011-07-05 | Discharge: 2011-07-07 | DRG: 778 | Disposition: A | Discharge status: Home / Self Care | Payer: Medicaid Other | Source: Ambulatory Visit | Attending: Obstetrics & Gynecology | Admitting: Obstetrics & Gynecology

## 2011-07-05 ENCOUNTER — Encounter (HOSPITAL_COMMUNITY): Payer: Self-pay | Admitting: *Deleted

## 2011-07-05 ENCOUNTER — Ambulatory Visit (INDEPENDENT_AMBULATORY_CARE_PROVIDER_SITE_OTHER): Payer: Medicaid Other | Admitting: Obstetrics & Gynecology

## 2011-07-05 DIAGNOSIS — O34219 Maternal care for unspecified type scar from previous cesarean delivery: Secondary | ICD-10-CM

## 2011-07-05 DIAGNOSIS — O4700 False labor before 37 completed weeks of gestation, unspecified trimester: Principal | ICD-10-CM | POA: Diagnosis present

## 2011-07-05 DIAGNOSIS — O09219 Supervision of pregnancy with history of pre-term labor, unspecified trimester: Secondary | ICD-10-CM

## 2011-07-05 LAB — RPR
RPR: BORDERLINE
RPR: NONREACTIVE

## 2011-07-05 LAB — CBC
Hemoglobin: 10.9 g/dL — ABNORMAL LOW (ref 12.0–15.0)
MCH: 29.6 pg (ref 26.0–34.0)
RBC: 3.68 MIL/uL — ABNORMAL LOW (ref 3.87–5.11)
WBC: 5.1 10*3/uL (ref 4.0–10.5)

## 2011-07-05 LAB — RUBELLA ANTIBODY, IGM: Rubella: IMMUNE

## 2011-07-05 LAB — HIV ANTIBODY (ROUTINE TESTING W REFLEX)
HIV: NONREACTIVE
HIV: NONREACTIVE

## 2011-07-05 LAB — ABO/RH: RH Type: POSITIVE

## 2011-07-05 LAB — ANTIBODY SCREEN: Antibody Screen: NEGATIVE

## 2011-07-05 MED ORDER — ZOLPIDEM TARTRATE 10 MG PO TABS: 10.0000 mg | ORAL_TABLET | Freq: Every evening | ORAL | Status: DC | PRN

## 2011-07-05 MED ORDER — DOCUSATE SODIUM 100 MG PO CAPS
100.0000 mg | ORAL_CAPSULE | Freq: Every day | ORAL | Status: DC
  Administered 2011-07-05 – 2011-07-06 (×2): 100 mg via ORAL

## 2011-07-05 MED ORDER — PROGESTERONE MICRONIZED 200 MG PO CAPS
200.0000 mg | ORAL_CAPSULE | Freq: Every day | ORAL | Status: DC
  Administered 2011-07-05 – 2011-07-06 (×2): 200 mg via ORAL
  Filled 2011-07-05 (×2): qty 1

## 2011-07-05 MED ORDER — CEFAZOLIN SODIUM-DEXTROSE 2-3 GM-% IV SOLR
2.0000 g | Freq: Once | INTRAVENOUS | Status: AC
  Administered 2011-07-05: 2 g via INTRAVENOUS
  Filled 2011-07-05: qty 50

## 2011-07-05 MED ORDER — LACTATED RINGERS IV SOLN
INTRAVENOUS | Status: DC
  Administered 2011-07-05 – 2011-07-06 (×3): via INTRAVENOUS

## 2011-07-05 MED ORDER — ACETAMINOPHEN 325 MG PO TABS: 650.0000 mg | ORAL_TABLET | ORAL | Status: DC | PRN

## 2011-07-05 MED ORDER — DOCUSATE SODIUM 100 MG PO CAPS
100.0000 mg | ORAL_CAPSULE | Freq: Every day | ORAL | Status: DC
  Administered 2011-07-06 – 2011-07-07 (×2): 100 mg via ORAL
  Filled 2011-07-05 (×4): qty 1

## 2011-07-05 MED ORDER — MAGNESIUM SULFATE 40 G IN LACTATED RINGERS - SIMPLE
2.0000 g/h | INTRAVENOUS | Status: DC
  Administered 2011-07-05: 4 g/h via INTRAVENOUS
  Administered 2011-07-06: 2 g/h via INTRAVENOUS
  Filled 2011-07-05 (×2): qty 500

## 2011-07-05 MED ORDER — MAGNESIUM SULFATE BOLUS VIA INFUSION
4.0000 g | Freq: Once | INTRAVENOUS | Status: DC
  Filled 2011-07-05: qty 500

## 2011-07-05 MED ORDER — PRENATAL MULTIVITAMIN CH
1.0000 | ORAL_TABLET | Freq: Every day | ORAL | Status: DC
  Administered 2011-07-05 – 2011-07-06 (×2): 1 via ORAL
  Filled 2011-07-05 (×2): qty 1

## 2011-07-05 MED ORDER — CALCIUM CARBONATE ANTACID 500 MG PO CHEW: 2.0000 | CHEWABLE_TABLET | ORAL | Status: DC | PRN

## 2011-07-05 MED ORDER — NIFEDIPINE ER OSMOTIC RELEASE 30 MG PO TB24
30.0000 mg | ORAL_TABLET | Freq: Every day | ORAL | Status: AC
  Administered 2011-07-05: 30 mg via ORAL

## 2011-07-05 MED ORDER — CALCIUM CARBONATE ANTACID 500 MG PO CHEW
2.0000 | CHEWABLE_TABLET | ORAL | Status: DC | PRN
  Administered 2011-07-06 (×2): 400 mg via ORAL
  Filled 2011-07-05 (×2): qty 2

## 2011-07-05 MED ORDER — CEFAZOLIN SODIUM 1-5 GM-% IV SOLN
1.0000 g | Freq: Three times a day (TID) | INTRAVENOUS | Status: DC
  Administered 2011-07-05 – 2011-07-07 (×6): 1 g via INTRAVENOUS
  Filled 2011-07-05 (×6): qty 50

## 2011-07-05 MED ORDER — PRENATAL PLUS 27-1 MG PO TABS: 1.0000 | ORAL_TABLET | Freq: Every day | ORAL | Status: DC

## 2011-07-05 MED ORDER — PRENATAL MULTIVITAMIN CH: 1.0000 | ORAL_TABLET | Freq: Every day | ORAL | Status: DC

## 2011-07-05 NOTE — H&P (Signed)
Tami Thomas is a 26 y.o. female presenting for preterm labor. Maternal Medical History:  Reason for admission: Reason for Admission:   nausea  This patient is a 26 year old G2 P0 101 at 28 weeks and 3 days who is being admitted for cervical change. The patient was evaluated yesterday in the office and was found to be a 1-1/2 cm dilated. She was brought back to the office and was found to be 2-3 cm. She does feel some abdominal pressure and intermittent irregular contractions. She denies vaginal bleeding, vaginal discharge, decreased fetal activity.   She was admitted on 12/10 for preterm labor. During this hospitalization she received magnesium sulfate for 12 hours for your protection, as well as 2 doses of betamethasone for fetal lung maturity. She was sent home on Prometrium which she has continued to take.  OB History    Grav Para Term Preterm Abortions TAB SAB Ect Mult Living   2 1  1      1      Prior to Admission medications   Medication Sig Start Date End Date Taking? Authorizing Provider  prenatal vitamin w/FE, FA (PRENATAL 1 + 1) 27-1 MG TABS Take 1 tablet by mouth daily.     Yes Historical Provider, MD  progesterone (PROMETRIUM) 200 MG capsule Take 1 capsule (200 mg total) by mouth at bedtime. 06/12/11 06/11/12 Yes Brooke Pace, DO    Past Medical History  Diagnosis Date  . Anemia   . UTI (lower urinary tract infection)     with last pregnancy  . Chlamydia     2009  . History of preterm delivery, currently pregnant    Past Surgical History  Procedure Date  . Cesarean section    Family History: family history includes Hypertension in her mother. Social History:  reports that she has never smoked. She has never used smokeless tobacco. She reports that she does not drink alcohol or use illicit drugs.  Review of Systems  Constitutional: Negative for fever and chills.  Eyes: Negative for blurred vision and double vision.  Respiratory: Negative for sputum  production, shortness of breath and wheezing.   Cardiovascular: Negative for chest pain and palpitations.  Gastrointestinal: Negative for nausea, vomiting, abdominal pain, diarrhea and constipation.  Genitourinary: Negative for dysuria, urgency, frequency and hematuria.  Neurological: Negative for weakness and headaches.      Blood pressure 102/69, pulse 88, temperature 98 F (36.7 C), temperature source Oral, resp. rate 20, height 5\' 4"  (1.626 m), weight 50.349 kg (111 lb), last menstrual period 12/17/2010. Exam Physical Exam  Constitutional: She is oriented to person, place, and time. She appears well-developed and well-nourished.  HENT:  Head: Normocephalic and atraumatic.  Eyes: Pupils are equal, round, and reactive to light.  Neck: Normal range of motion. Neck supple.  Cardiovascular: Normal rate and regular rhythm.   Respiratory: Effort normal and breath sounds normal. No respiratory distress. She has no wheezes. She has no rales. She exhibits no tenderness.  GI: Soft. Bowel sounds are normal. She exhibits no distension and no mass. There is no tenderness. There is no rebound and no guarding.  Musculoskeletal: Normal range of motion. She exhibits no edema and no tenderness.  Neurological: She is alert and oriented to person, place, and time. She has normal reflexes.  Skin: Skin is warm and dry. No rash noted. No erythema. No pallor.    Prenatal labs: ABO, Rh: O/Positive/-- (01/04 0000) Antibody: Negative (01/04 0000) Rubella: Immune (01/04 0000) RPR: Nonreactive, Nonreactive, Nonreactive,  Borderline, Nonreactive (01/04 0000)  HBsAg: Negative (01/04 0000)  HIV: Non-reactive, Non-reactive, Non-reactive, Non-reactive (01/04 0000)  GBS:   unknown  Assessment/Plan: #76 26 year old G2 P0 101 at 28 weeks and 3 days #2 preterm labor #3 history of C-section - Vertical Incision  We'll admit the patient and start magnesium sulfate for neural protection and tocolysis. We will also  provide antibiotic coverage for GBS. We will continue Prometrium vaginally. While on magnesium we will continue with continuous monitoring. As the patient has already had steroids that previous hospitalization, she does not need repeat dose of steroids.  Cederic Mozley JEHIEL 07/05/2011, 11:56 AM

## 2011-07-05 NOTE — Progress Notes (Signed)
Addended by: Lesly Dukes on: 07/05/2011 09:30 AM   Modules accepted: Orders

## 2011-07-05 NOTE — Progress Notes (Signed)
Pt here for rpt cervical exam.  Pt had a few contractions overnight.  Cervix 2 1/2 / 70 and lower in the pelvis.  LUS more bulging.  Will admit for observation, tocolysis.

## 2011-07-05 NOTE — Plan of Care (Signed)
Problem: Consults Goal: Birthing Suites Patient Information Press F2 to bring up selections list Outcome: Completed/Met Date Met:  07/05/11  Pt < [redacted] weeks EGA

## 2011-07-05 NOTE — Progress Notes (Signed)
Pt reports two episodes of "baby balling up" overnight. No other complaints today. Will page Dr. Penne Lash to check for any cervical changes.

## 2011-07-05 NOTE — Progress Notes (Signed)
Pt states she can't be admitted right now due to child care issues.  Will give one dose of procardia and then have her come back in several hours for admission once she gets her childcare in order.

## 2011-07-06 MED ORDER — NIFEDIPINE ER 30 MG PO TB24
30.0000 mg | ORAL_TABLET | Freq: Two times a day (BID) | ORAL | Status: DC
  Administered 2011-07-06 – 2011-07-07 (×3): 30 mg via ORAL
  Filled 2011-07-06 (×3): qty 1

## 2011-07-06 NOTE — Progress Notes (Signed)
Patient ID: Tami Thomas, female   DOB: [birth year], 26 y.o.   MRN: [id number]  FACULTY PRACTICE ANTEPARTUM(COMPREHENSIVE) NOTE  Tami Thomas is a 26 y.o. G2P0101 at [redacted]w[redacted]d  who is admitted for Preterm labor.   Length of Stay:  1  Days  Subjective:  Patient reports the fetal movement as active. Patient reports uterine contraction  activity as none. Patient reports  vaginal bleeding as none. Patient describes fluid per vagina as None.  Vitals:  Blood pressure 102/65, pulse 85, temperature 98.1 F (36.7 C), temperature source Oral, resp. rate 18, height 5\' 4"  (1.626 m), weight 50.349 kg (111 lb), last menstrual period 12/17/2010. Physical Examination:  General appearance - alert, well appearing, and in no distress Abdominal Exam:  Soft, nontender Cervical Exam: Evaluated by digital exam. and found to be 2-3/ 50%/-2 Extremities: Homans sign is negative, no sign of DVT   Membranes:intact   Labs:  Recent Results (from the past 24 hour(s))  CBC   Collection Time   07/05/11 11:15 AM      Component Value Range   WBC 5.1  4.0 - 10.5 (K/uL)   RBC 3.68 (*) 3.87 - 5.11 (MIL/uL)   Hemoglobin 10.9 (*) 12.0 - 15.0 (g/dL)   HCT 72.5 (*) 36.6 - 46.0 (%)   MCV 90.2  78.0 - 100.0 (fL)   MCH 29.6  26.0 - 34.0 (pg)   MCHC 32.8  30.0 - 36.0 (g/dL)   RDW 44.0  34.7 - 42.5 (%)   Platelets 155  150 - 400 (K/uL)    Medications:  Scheduled    . ceFAZolin (ANCEF) IV  2 g Intravenous Once   Followed by  . ceFAZolin (ANCEF) IV  1 g Intravenous Q8H  . docusate sodium  100 mg Oral Daily  . docusate sodium  100 mg Oral Daily  . magnesium  4 g Intravenous Once  . prenatal multivitamin  1 tablet Oral Daily  . progesterone  200 mg Oral QHS  . DISCONTD: prenatal multivitamin  1 tablet Oral Daily  . DISCONTD: prenatal vitamin w/FE, FA  1 tablet Oral Daily   I have reviewed the patient's current medications.  ASSESSMENT: Patient Active Problem List  Diagnoses  . History of preterm delivery,  currently pregnant  . H/O: cesarean section  . Supervision of high-risk pregnancy    PLAN: 26 yo G2P0101 at 28 weeks 4 days with preterm labor and cervical change.  No change this past 24 hours.  Will d/c magnesium sulfate and start procardia.  Consider d/c tomorrow if no cervical change.  Joselynne Killam H. 07/06/2011,8:13 AM

## 2011-07-06 NOTE — Progress Notes (Signed)
Due to patient's position - fetal monitor tracing maternal pulse at times.

## 2011-07-07 MED ORDER — NIFEDIPINE ER 30 MG PO TB24: 30.0000 mg | ORAL_TABLET | Freq: Two times a day (BID) | ORAL | Status: DC

## 2011-07-07 NOTE — Discharge Summary (Signed)
Antenatal Physician Discharge Summary  Patient ID: Tami Thomas MRN: [id number] DOB/AGE: 09/12/1985 26 y.o.  Admit date: 07/05/2011 Discharge date: 07/07/2011  Admission Diagnoses:  Patient Active Problem List  Diagnoses  . History of preterm delivery, currently pregnant  . H/O: cesarean section  . Supervision of high-risk pregnancy  . Preterm labor without delivery in third trimester     Discharge Diagnoses:  Patient Active Problem List  Diagnoses  . History of preterm delivery, currently pregnant  . H/O: cesarean section  . Supervision of high-risk pregnancy  . Preterm labor without delivery in third trimester     Prenatal Procedures: none  Consults: none  Significant Diagnostic Studies: labs: U/A reveal ketones, 1+ proteinuria, O pos, HIV neg, RPR NR, HepBsAg neg, Ab screen neg, FFN neg CBC    Component Value Date/Time   WBC 5.1 07/05/2011 1115   RBC 3.68* 07/05/2011 1115   HGB 10.9* 07/05/2011 1115   HCT 33.2* 07/05/2011 1115   PLT 155 07/05/2011 1115   MCV 90.2 07/05/2011 1115   MCH 29.6 07/05/2011 1115   MCHC 32.8 07/05/2011 1115   RDW 13.9 07/05/2011 1115      Treatments: Magnesium Sulfate for tocolysis and Procardia.  IV antibiotics for GBS prophylaxis.   Hospital Course:  This is a Tami Thomas 26 y.o. [phone] with IUP at [redacted]w[redacted]d admitted for Preterm labor.  She reported baby balling up.  Was evaluated on serial days in the clinic and her cervix changed from 1 cm to 2-3 cm.  She had a negative FFN.  She was brought in, started on Magnesium Sulfate and given IV Ancef for GBS prophylaxis.  She was serially examined and had no significant cervical change.  She was discharged when stable.   Discharged Condition: good  Discharge Exam: Blood pressure 89/55, pulse 93, temperature 98.9 F (37.2 C), temperature source Oral, resp. rate 18, height 5\' 4"  (1.626 m), weight 50.349 kg (111 lb), last menstrual period 12/17/2010. General appearance: alert and cooperative GI: soft,  non-tender; bowel sounds normal; no masses,  no organomegaly Pelvic: external genitalia normal and pregnancy positive findings: cervix was a loose one centimeter on discharge. NST was reassuring and there continued to be uterine irritability.  Disposition: Home or Self Care  Discharge Orders    Future Orders Please Complete By Expires   Fetal Kick Count:  Lie on our left side for one hour after a meal, and count the number of times your baby kicks.  If it is less than 5 times, get up, move around and drink some juice.  Repeat the test 30 minutes later.  If it is still less than 5 kicks in an hour, notify your doctor.      Discharge activity: Bedrest      Discharge activity:  Bathroom / Shower only      Discharge diet:  No restrictions      PRETERM LABOR:  Includes any of the follwing symptoms that occur between 20 - [redacted] weeks gestation.  If these symptoms are not stopped, preterm labor can result in preterm delivery, placing your baby at risk      Notify physician for menstrual like cramps      Notify physician for uterine contractions.  These may be painless and feel like the uterus is tightening or the baby is  "balling up"      Notify physician for low, dull backache, unrelieved by heat or Tylenol      Notify physician for intestinal cramps, with or without  diarrhea, sometimes described as "gas pain"      Notify physician for pelvic pressure      Notify physician for increase or change in vaginal discharge      Notify physician for vaginal bleeding      Notify physician for a general feeling that "something is not right"      Notify physician for leaking of fluid      Do not have sex or do anything that might make you have an orgasm      HIV antibody      Comments:   This external order was created through the Results Console.   Rubella antibody, IgM      Comments:   This external order was created through the Results Console.   Hepatitis B surface antigen      Comments:   This  external order was created through the Results Console.   RPR      Comments:   This external order was created through the Results Console.   HIV antibody      Comments:   This external order was created through the Results Console.   RPR      Comments:   This external order was created through the Results Console.   RPR      Comments:   This external order was created through the Results Console.   HIV antibody      Comments:   This external order was created through the Results Console.   RPR      Comments:   This external order was created through the Results Console.   HIV antibody      Comments:   This external order was created through the Results Console.   RPR      Comments:   This external order was created through the Results Console.   Antibody screen      Comments:   This external order was created through the Results Console.   ABO/Rh      Comments:   This external order was created through the Results Console.     Current Discharge Medication List    START taking these medications   Details  NIFEdipine (PROCARDIA-XL/ADALAT CC) 30 MG 24 hr tablet Take 1 tablet (30 mg total) by mouth 2 (two) times daily. Qty: 60 tablet, Refills: 2      CONTINUE these medications which have NOT CHANGED   Details  prenatal vitamin w/FE, FA (PRENATAL 1 + 1) 27-1 MG TABS Take 1 tablet by mouth daily.      progesterone (PROMETRIUM) 200 MG capsule Take 1 capsule (200 mg total) by mouth at bedtime. Qty: 60 capsule, Refills: 1       Follow-up Information    Follow up with [facility] in 4 days.   Contact information:   [address] [place], [place]  [address] [phone]         Signed: Reva Bores 07/07/2011, 7:24 AM

## 2011-07-07 NOTE — Progress Notes (Signed)
Patient ID: Tami Thomas, female   DOB: [birth year], 26 y.o.   MRN: [id number] FACULTY PRACTICE ANTEPARTUM(COMPREHENSIVE) NOTE  Tami Thomas is a 26 y.o. G2P0101 at [redacted]w[redacted]d  who is admitted for Preterm labor.   Fetal presentation is unsure. Length of Stay:  2  Days  Subjective: Still. having some balling up Patient reports good fetal movement.  She reports uterine contractions, no bleeding and no loss of fluid per vagina.  Vitals:  Blood pressure 89/55, pulse 93, temperature 98.9 F (37.2 C), temperature source Oral, resp. rate 18, height 5\' 4"  (1.626 m), weight 50.349 kg (111 lb), last menstrual period 12/17/2010. Physical Examination:  General appearance - alert, well appearing, and in no distress Abdomen - soft, nontender, nondistended, no masses or organomegaly Fundal Height:  size equals dates Pelvic Exam:  normal external genitalia, vulva, vagina, cervix, uterus and adnexa. Cervical Exam: Evaluated by digital exam. and Position: posterior and found to be 1 cm/ 50%/-2 and fetal presentation is unsure. Extremities: extremities normal, atraumatic, no cyanosis or edema  Membranes:intact  Fetal Monitoring:  Baseline: 140 bpm, Variability: Good {> 6 bpm) and Accelerations: Reactive  Labs:  No results found for this or any previous visit (from the past 24 hour(s)).   Medications:  Scheduled    . ceFAZolin (ANCEF) IV  1 g Intravenous Q8H  . docusate sodium  100 mg Oral Daily  . docusate sodium  100 mg Oral Daily  . magnesium  4 g Intravenous Once  . NIFEdipine  30 mg Oral BID  . prenatal multivitamin  1 tablet Oral Daily  . progesterone  200 mg Oral QHS   I have reviewed the patient's current medications.  ASSESSMENT: Patient Active Problem List  Diagnoses  . History of preterm delivery, currently pregnant  . H/O: cesarean section  . Supervision of high-risk pregnancy  . Preterm labor without delivery in third trimester    PLAN: D/c home--off Magnesium x 24 hours and no  significant cervical change. Continue routine antenatal care.   Tami Thomas S 07/07/2011,7:36 AM

## 2011-07-10 NOTE — Progress Notes (Signed)
UR chart review completed post discharge.

## 2011-07-11 ENCOUNTER — Ambulatory Visit (INDEPENDENT_AMBULATORY_CARE_PROVIDER_SITE_OTHER): Payer: Medicaid Other | Admitting: Family Medicine

## 2011-07-11 DIAGNOSIS — O4700 False labor before 37 completed weeks of gestation, unspecified trimester: Secondary | ICD-10-CM

## 2011-07-11 LAB — POCT URINALYSIS DIP (DEVICE)
Protein, ur: NEGATIVE mg/dL
Specific Gravity, Urine: 1.025 (ref 1.005–1.030)
Urobilinogen, UA: 0.2 mg/dL (ref 0.0–1.0)
pH: 6.5 (ref 5.0–8.0)

## 2011-07-11 MED ORDER — PROGESTERONE MICRONIZED 200 MG PO CAPS: 200.0000 mg | ORAL_CAPSULE | Freq: Every day | ORAL | Status: DC

## 2011-07-11 NOTE — Progress Notes (Signed)
Pain/pressure-lower pelvic  Pulse- 102

## 2011-07-11 NOTE — Patient Instructions (Signed)

## 2011-07-11 NOTE — Progress Notes (Signed)
Not feeling any contractions--some pelvic pressure every now and then.

## 2011-07-25 ENCOUNTER — Ambulatory Visit (INDEPENDENT_AMBULATORY_CARE_PROVIDER_SITE_OTHER): Payer: Medicaid Other | Admitting: Obstetrics & Gynecology

## 2011-07-25 DIAGNOSIS — O4700 False labor before 37 completed weeks of gestation, unspecified trimester: Secondary | ICD-10-CM

## 2011-07-25 DIAGNOSIS — O0990 Supervision of high risk pregnancy, unspecified, unspecified trimester: Secondary | ICD-10-CM

## 2011-07-25 LAB — FETAL FIBRONECTIN: Fetal Fibronectin: NEGATIVE

## 2011-07-25 LAB — POCT URINALYSIS DIP (DEVICE)
Bilirubin Urine: NEGATIVE
Glucose, UA: NEGATIVE mg/dL
Hgb urine dipstick: NEGATIVE
Nitrite: NEGATIVE
Protein, ur: NEGATIVE mg/dL
Specific Gravity, Urine: >=1.03 (ref 1.005–1.030)
Urobilinogen, UA: 1 mg/dL (ref 0.0–1.0)
pH: 6 (ref 5.0–8.0)

## 2011-07-25 NOTE — Progress Notes (Signed)
No complaints.  2 cm/50/ soft  Cont prometrium and procardia.  NO signs PTL  (last cervical exam was 2 cm)  FFN sent

## 2011-07-25 NOTE — Patient Instructions (Signed)
Fluphenazine depot injection What is this medicine? FLUPHENAZINE (floo FEN a zeen) is used to treat the symptoms of schizophrenia and other mental disorders. Not all fluphenazine injections are the same. Do not change the brand you are using without checking with your doctor or health care professional. This medicine may be used for other purposes; ask your health care provider or pharmacist if you have questions. What should I tell my health care provider before I take this medicine? They need to know if you have any of these conditions: -blood disorders or disease -dementia -head injury -heart disease -liver disease -Parkinson's disease -Reye's syndrome -uncontrollable movement disorder -an unusual or allergic reaction to fluphenazine, other medicines foods, dyes, or preservatives -pregnant or trying to get pregnant -breast-feeding How should I use this medicine? This medicine is for injection into a muscle or under the skin. It is given by a health-care professional in a hospital or clinic setting. Talk to your pediatrician regarding the use of this medicine in children. Special care may be needed. Overdosage: If you think you have taken too much of this medicine contact a poison control center or emergency room at once. NOTE: This medicine is only for you. Do not share this medicine with others. What if I miss a dose? This does not apply. What may interact with this medicine? Do not take this medicine with any of the following medications: -amoxapine -certain antibiotics like gatifloxacin, grepafloxacin, sparfloxacin -cisapride -clozapine -dofetilide -droperidol -ephedrine -medicines for mental depression like escitalopram, fluoxetine, paroxetine, sertraline -ibutilide -levomethadyl -maprotiline -other phenothiazines like chlorpromazine, mesoridazine, prochlorperazine, and  thioridazine -pimozide -pindolol -propranolol -risperidone -sotalol -trimethobenzamide -ziprasidone This medicine may also interact with the following medications: -atropine -some medications for high blood pressure or heart problems This list may not describe all possible interactions. Give your health care provider a list of all the medicines, herbs, non-prescription drugs, or dietary supplements you use. Also tell them if you smoke, drink alcohol, or use illegal drugs. Some items may interact with your medicine. What should I watch for while using this medicine? You may get drowsy or dizzy. Do not drive, use machinery, or do anything that needs mental alertness until you know how this medicine affects you. Do not stand or sit up quickly, especially if you are an older patient. This reduces the risk of dizzy or fainting spells. Alcohol may interfere with the effect of this medicine. Avoid alcoholic drinks. This medicine can reduce the response of your body to heat or cold. Try not to get overheated. Avoid temperature extremes, such as saunas, hot tubs, or very hot or cold baths or showers. Dress warmly in cold weather. This medicine can make you more sensitive to the sun. Keep out of the sun. If you cannot avoid being in the sun, wear protective clothing and use sunscreen. Do not use sun lamps or tanning beds/booths. Your mouth may get dry. Chewing sugarless gum or sucking hard candy, and drinking plenty of water may help. Contact your doctor if the problem does not go away or is severe. If you are going to have surgery, tell your doctor or health care professional that you are taking this medicine. What side effects may I notice from receiving this medicine? Side effects that you should report to your doctor or health care professional as soon as possible: -allergic reactions like skin rash, itching or hives, swelling of the face, lips, or tongue -blurred vision -breast enlargement in men or  women -breast milk in women who are  not breast-feeding -chest pain, fast or irregular heartbeat -confusion, restlessness -dark yellow or brown urine -difficulty breathing or swallowing -dizziness or fainting spells -drooling, shaking -fever, chills, sore throat -involuntary or uncontrollable movements of the eyes, mouth, head, arms, and legs -seizures -stomach area pain -unusual bleeding or bruising -unusually weak or tired -yellowing of skin or eyes Side effects that usually do not require medical attention (report to your doctor or health care professional if they continue or are bothersome): -difficulty passing urine -difficulty sleeping -headache -sexual dysfunction This list may not describe all possible side effects. Call your doctor for medical advice about side effects. You may report side effects to FDA at 1-800-FDA-1088. Where should I keep my medicine? This drug is given in a hospital or clinic and will not be stored at home. NOTE: This sheet is a summary. It may not cover all possible information. If you have questions about this medicine, talk to your doctor, pharmacist, or health care provider.  2012, [employer]. (10/26/2007 4:14:15 PM)Breastfeeding BENEFITS OF BREASTFEEDING For the baby  The first milk (colostrum) helps the baby's digestive system function better.   There are antibodies from the mother in the milk that help the baby fight off infections.   The baby has a lower incidence of asthma, allergies, and SIDS (sudden infant death syndrome).   The nutrients in breast milk are better than formulas for the baby and helps the baby's brain grow better.   Babies who breastfeed have less gas, colic, and constipation.  For the mother  Breastfeeding helps develop a very special bond between mother and baby.   It is more convenient, always available at the correct temperature and cheaper than formula feeding.   It burns calories in the mother and  helps with losing weight that was gained during pregnancy.   It makes the uterus contract back down to normal size faster and slows bleeding following delivery.   Breastfeeding mothers have a lower risk of developing breast cancer.  NURSE FREQUENTLY  A healthy, full-term baby may breastfeed as often as every hour or space his or her feedings to every 3 hours.   How often to nurse will vary from baby to baby. Watch your baby for signs of hunger, not the clock.   Nurse as often as the baby requests, or when you feel the need to reduce the fullness of your breasts.   Awaken the baby if it has been 3 to 4 hours since the last feeding.   Frequent feeding will help the mother make more milk and will prevent problems like sore nipples and engorgement of the breasts.  BABY'S POSITION AT THE BREAST  Whether lying down or sitting, be sure that the baby's tummy is facing your tummy.   Support the breast with 4 fingers underneath the breast and the thumb above. Make sure your fingers are well away from the nipple and baby's mouth.   Stroke the baby's lips and cheek closest to the breast gently with your finger or nipple.   When the baby's mouth is open wide enough, place all of your nipple and as much of the dark area around the nipple as possible into your baby's mouth.   Pull the baby in close so the tip of the nose and the baby's cheeks touch the breast during the feeding.  FEEDINGS  The length of each feeding varies from baby to baby and from feeding to feeding.   The baby must suck about 2 to 3  minutes for your milk to get to him or her. This is called a "let down." For this reason, allow the baby to feed on each breast as long as he or she wants. Your baby will end the feeding when he or she has received the right balance of nutrients.   To break the suction, put your finger into the corner of the baby's mouth and slide it between his or her gums before removing your breast from his or her  mouth. This will help prevent sore nipples.  REDUCING BREAST ENGORGEMENT  In the first week after your baby is born, you may experience signs of breast engorgement. When breasts are engorged, they feel heavy, warm, full, and may be tender to the touch. You can reduce engorgement if you:   Nurse frequently, every 2 to 3 hours. Mothers who breastfeed early and often have fewer problems with engorgement.   Place light ice packs on your breasts between feedings. This reduces swelling. Wrap the ice packs in a lightweight towel to protect your skin.   Apply moist hot packs to your breast for 5 to 10 minutes before each feeding. This increases circulation and helps the milk flow.   Gently massage your breast before and during the feeding.   Make sure that the baby empties at least one breast at every feeding before switching sides.   Use a breast pump to empty the breasts if your baby is sleepy or not nursing well. You may also want to pump if you are returning to work or or you feel you are getting engorged.   Avoid bottle feeds, pacifiers or supplemental feedings of water or juice in place of breastfeeding.   Be sure the baby is latched on and positioned properly while breastfeeding.   Prevent fatigue, stress, and anemia.   Wear a supportive bra, avoiding underwire styles.   Eat a balanced diet with enough fluids.  If you follow these suggestions, your engorgement should improve in 24 to 48 hours. If you are still experiencing difficulty, call your lactation consultant or caregiver. IS MY BABY GETTING ENOUGH MILK? Sometimes, mothers worry about whether their babies are getting enough milk. You can be assured that your baby is getting enough milk if:  The baby is actively sucking and you hear swallowing.   The baby nurses at least 8 to 12 times in a 24 hour time period. Nurse your baby until he or she unlatches or falls asleep at the first breast (at least 10 to 20 minutes), then offer the  second side.   The baby is wetting 5 to 6 disposable diapers (6 to 8 cloth diapers) in a 24 hour period by 85 to 77 days of age.   The baby is having at least 2 to 3 stools every 24 hours for the first few months. Breast milk is all the food your baby needs. It is not necessary for your baby to have water or formula. In fact, to help your breasts make more milk, it is best not to give your baby supplemental feedings during the early weeks.   The stool should be soft and yellow.   The baby should gain 4 to 7 ounces per week after he is 58 days old.  TAKE CARE OF YOURSELF Take care of your breasts by:  Bathing or showering daily.   Avoiding the use of soaps on your nipples.   Start feedings on your left breast at one feeding and on your right breast  at the next feeding.   You will notice an increase in your milk supply 2 to 5 days after delivery. You may feel some discomfort from engorgement, which makes your breasts very firm and often tender. Engorgement "peaks" out within 24 to 48 hours. In the meantime, apply warm moist towels to your breasts for 5 to 10 minutes before feeding. Gentle massage and expression of some milk before feeding will soften your breasts, making it easier for your baby to latch on. Wear a well fitting nursing bra and air dry your nipples for 10 to 15 minutes after each feeding.   Only use cotton bra pads.   Only use pure lanolin on your nipples after nursing. You do not need to wash it off before nursing.  Take care of yourself by:   Eating well-balanced meals and nutritious snacks.   Drinking milk, fruit juice, and water to satisfy your thirst (about 8 glasses a day).   Getting plenty of rest.   Increasing calcium in your diet (1200 mg a day).   Avoiding foods that you notice affect the baby in a bad way.  SEEK MEDICAL CARE IF:   You have any questions or difficulty with breastfeeding.   You need help.   You have a hard, red, sore area on your breast,  accompanied by a fever of 100.5 F (38.1 C) or more.   Your baby is too sleepy to eat well or is having trouble sleeping.   Your baby is wetting less than 6 diapers per day, by 24 days of age.   Your baby's skin or white part of his or her eyes is more yellow than it was in the hospital.   You feel depressed.  Document Released: 06/17/2005 Document Revised: 02/27/2011 Document Reviewed: 01/30/2009 [facility] Patient Information 2012 [place], [place].

## 2011-07-25 NOTE — Progress Notes (Signed)
Pelvic pain every now and then especially on LLQ.

## 2011-07-25 NOTE — Progress Notes (Signed)
Addended by: Lesly Dukes on: 07/25/2011 10:40 AM   Modules accepted: Orders

## 2011-08-08 ENCOUNTER — Ambulatory Visit (INDEPENDENT_AMBULATORY_CARE_PROVIDER_SITE_OTHER): Payer: Medicaid Other | Admitting: Family Medicine

## 2011-08-08 DIAGNOSIS — O0990 Supervision of high risk pregnancy, unspecified, unspecified trimester: Secondary | ICD-10-CM

## 2011-08-08 DIAGNOSIS — O09219 Supervision of pregnancy with history of pre-term labor, unspecified trimester: Secondary | ICD-10-CM

## 2011-08-08 LAB — POCT URINALYSIS DIP (DEVICE)
Hgb urine dipstick: NEGATIVE
Protein, ur: 30 mg/dL — AB
Specific Gravity, Urine: 1.02 (ref 1.005–1.030)
Urobilinogen, UA: 0.2 mg/dL (ref 0.0–1.0)

## 2011-08-08 MED ORDER — NIFEDIPINE ER 30 MG PO TB24: 60.0000 mg | ORAL_TABLET | Freq: Two times a day (BID) | ORAL | Status: DC

## 2011-08-08 NOTE — Progress Notes (Signed)
Pressure-stomach.  Pulse- 110 Pt would like to be checked

## 2011-08-08 NOTE — Progress Notes (Signed)
irreg contractions.  No cervical change - increase procardia and continue with prometrium.  F/u in 2 weeks.

## 2011-08-08 NOTE — Patient Instructions (Signed)
Preventing Preterm Labor Preterm labor is when a pregnant woman has contractions that cause the cervix to open, shorten, and thin before 37 weeks of pregnancy. You will have regular contractions (tightening) 2 to 3 minutes apart. This usually causes discomfort or pain. HOME CARE  Eat a healthy diet.   Take your vitamins as told by your doctor.   Drink enough fluids to keep your pee (urine) clear or pale yellow every day.   Get rest and sleep.   Do not have sex if you are at high risk for preterm labor.   Follow your doctor's advice about activity, medicines, and tests.   Avoid stress.   Avoid hard labor or exercise that lasts for a long time.   Do not smoke.  GET HELP RIGHT AWAY IF:   You are having contractions.   You have belly (abdominal) pain.   You have bleeding from your vagina.   You have pain when you pee (urinate).   You have abnormal discharge from your vagina.   You have a temperature by mouth above 102 F (38.9 C).  MAKE SURE YOU:  Understand these instructions.   Will watch your condition.   Will get help if you are not doing well or get worse.  Document Released: 09/13/2008 Document Revised: 02/27/2011 Document Reviewed: 09/13/2008 [employer] Patient Information 2012 [employer]. 

## 2011-08-19 NOTE — Progress Notes (Signed)
Post discharge review completed for dates of service of 07/05/11-07/07/11.

## 2011-08-22 ENCOUNTER — Ambulatory Visit (INDEPENDENT_AMBULATORY_CARE_PROVIDER_SITE_OTHER): Payer: Medicaid Other | Admitting: Physician Assistant

## 2011-08-22 ENCOUNTER — Encounter: Payer: Self-pay | Admitting: Physician Assistant

## 2011-08-22 DIAGNOSIS — O4700 False labor before 37 completed weeks of gestation, unspecified trimester: Secondary | ICD-10-CM

## 2011-08-22 LAB — POCT URINALYSIS DIP (DEVICE)
Hgb urine dipstick: NEGATIVE
Nitrite: NEGATIVE
Protein, ur: NEGATIVE mg/dL
Urobilinogen, UA: 0.2 mg/dL (ref 0.0–1.0)
pH: 7 (ref 5.0–8.0)

## 2011-08-22 NOTE — Patient Instructions (Signed)

## 2011-08-22 NOTE — Progress Notes (Signed)
No complaints. Denies PTL s/s. Pt excited that she passed gest age of previous preterm birth.  Continued use of procardia and Prometrium to 37 weeks. Schedule RLTCS/GBS at next visit. PTL precautions.

## 2011-09-05 ENCOUNTER — Encounter: Payer: Medicaid Other | Admitting: Obstetrics & Gynecology

## 2011-09-11 ENCOUNTER — Encounter: Payer: Medicaid Other | Admitting: Family Medicine

## 2011-09-11 ENCOUNTER — Inpatient Hospital Stay (HOSPITAL_COMMUNITY)
Admission: AD | Admit: 2011-09-11 | Discharge: 2011-09-13 | DRG: 766 | Disposition: A | Discharge status: Home / Self Care | Payer: Medicaid Other | Attending: Family Medicine | Admitting: Family Medicine

## 2011-09-11 ENCOUNTER — Inpatient Hospital Stay (HOSPITAL_COMMUNITY): Payer: Medicaid Other | Admitting: Anesthesiology

## 2011-09-11 ENCOUNTER — Encounter (HOSPITAL_COMMUNITY): Payer: Self-pay | Admitting: *Deleted

## 2011-09-11 ENCOUNTER — Encounter (HOSPITAL_COMMUNITY): Payer: Self-pay | Admitting: Anesthesiology

## 2011-09-11 ENCOUNTER — Encounter: Payer: Self-pay | Admitting: Family Medicine

## 2011-09-11 ENCOUNTER — Encounter (HOSPITAL_COMMUNITY)
Admission: AD | Disposition: A | Discharge status: Home / Self Care | Payer: Self-pay | Source: Home / Self Care | Attending: Family Medicine

## 2011-09-11 DIAGNOSIS — O9903 Anemia complicating the puerperium: Secondary | ICD-10-CM

## 2011-09-11 DIAGNOSIS — Z98891 History of uterine scar from previous surgery: Secondary | ICD-10-CM

## 2011-09-11 DIAGNOSIS — O34219 Maternal care for unspecified type scar from previous cesarean delivery: Secondary | ICD-10-CM

## 2011-09-11 DIAGNOSIS — D649 Anemia, unspecified: Secondary | ICD-10-CM | POA: Diagnosis not present

## 2011-09-11 LAB — CBC
MCH: 29.1 pg (ref 26.0–34.0)
MCHC: 32.2 g/dL (ref 30.0–36.0)
Platelets: 167 10*3/uL (ref 150–400)

## 2011-09-11 LAB — RPR: RPR Ser Ql: NONREACTIVE

## 2011-09-11 SURGERY — Surgical Case
Anesthesia: Spinal | Site: Abdomen | Wound class: Clean Contaminated

## 2011-09-11 MED ORDER — BUPIVACAINE HCL (PF) 0.25 % IJ SOLN
INTRAMUSCULAR | Status: AC
  Filled 2011-09-11: qty 30

## 2011-09-11 MED ORDER — OXYTOCIN 20 UNITS IN LACTATED RINGERS INFUSION - SIMPLE
INTRAVENOUS | Status: DC | PRN
  Administered 2011-09-11: 20 [IU] via INTRAVENOUS

## 2011-09-11 MED ORDER — SENNOSIDES-DOCUSATE SODIUM 8.6-50 MG PO TABS
2.0000 | ORAL_TABLET | Freq: Every day | ORAL | Status: DC
  Administered 2011-09-11 – 2011-09-12 (×2): 2 via ORAL

## 2011-09-11 MED ORDER — OXYTOCIN 20 UNITS IN LACTATED RINGERS INFUSION - SIMPLE: 125.0000 mL/h | INTRAVENOUS | Status: AC

## 2011-09-11 MED ORDER — FENTANYL CITRATE 0.05 MG/ML IJ SOLN
INTRAMUSCULAR | Status: DC | PRN
  Administered 2011-09-11: 25 ug via INTRATHECAL

## 2011-09-11 MED ORDER — ONDANSETRON HCL 4 MG/2ML IJ SOLN: 4.0000 mg | INTRAMUSCULAR | Status: DC | PRN

## 2011-09-11 MED ORDER — DIBUCAINE 1 % RE OINT: 1.0000 "application " | TOPICAL_OINTMENT | RECTAL | Status: DC | PRN

## 2011-09-11 MED ORDER — NALBUPHINE HCL 10 MG/ML IJ SOLN
5.0000 mg | INTRAMUSCULAR | Status: DC | PRN
  Administered 2011-09-11: 10 mg via INTRAVENOUS
  Filled 2011-09-11 (×2): qty 1

## 2011-09-11 MED ORDER — FAMOTIDINE IN NACL 20-0.9 MG/50ML-% IV SOLN: 20.0000 mg | Freq: Once | INTRAVENOUS | Status: DC

## 2011-09-11 MED ORDER — TETANUS-DIPHTH-ACELL PERTUSSIS 5-2.5-18.5 LF-MCG/0.5 IM SUSP
0.5000 mL | Freq: Once | INTRAMUSCULAR | Status: AC
  Administered 2011-09-12: 0.5 mL via INTRAMUSCULAR
  Filled 2011-09-11: qty 0.5

## 2011-09-11 MED ORDER — SCOPOLAMINE 1 MG/3DAYS TD PT72
1.0000 | MEDICATED_PATCH | Freq: Once | TRANSDERMAL | Status: DC
  Administered 2011-09-11: 1.5 mg via TRANSDERMAL

## 2011-09-11 MED ORDER — DIPHENHYDRAMINE HCL 25 MG PO CAPS: 25.0000 mg | ORAL_CAPSULE | Freq: Four times a day (QID) | ORAL | Status: DC | PRN

## 2011-09-11 MED ORDER — SODIUM CHLORIDE 0.9 % IV SOLN
1.0000 ug/kg/h | INTRAVENOUS | Status: DC | PRN
  Filled 2011-09-11: qty 2.5

## 2011-09-11 MED ORDER — ONDANSETRON HCL 4 MG/2ML IJ SOLN
INTRAMUSCULAR | Status: AC
  Filled 2011-09-11: qty 2

## 2011-09-11 MED ORDER — IBUPROFEN 600 MG PO TABS
600.0000 mg | ORAL_TABLET | Freq: Four times a day (QID) | ORAL | Status: DC
  Administered 2011-09-11 – 2011-09-13 (×9): 600 mg via ORAL
  Filled 2011-09-11 (×4): qty 1

## 2011-09-11 MED ORDER — NALOXONE HCL 0.4 MG/ML IJ SOLN: 0.4000 mg | INTRAMUSCULAR | Status: DC | PRN

## 2011-09-11 MED ORDER — SIMETHICONE 80 MG PO CHEW
80.0000 mg | CHEWABLE_TABLET | Freq: Three times a day (TID) | ORAL | Status: DC
  Administered 2011-09-11 – 2011-09-13 (×8): 80 mg via ORAL

## 2011-09-11 MED ORDER — KETOROLAC TROMETHAMINE 60 MG/2ML IM SOLN
60.0000 mg | Freq: Once | INTRAMUSCULAR | Status: AC | PRN
  Administered 2011-09-11: 60 mg via INTRAMUSCULAR

## 2011-09-11 MED ORDER — ONDANSETRON HCL 4 MG/2ML IJ SOLN
INTRAMUSCULAR | Status: DC | PRN
  Administered 2011-09-11: 4 mg via INTRAVENOUS

## 2011-09-11 MED ORDER — IBUPROFEN 600 MG PO TABS
600.0000 mg | ORAL_TABLET | Freq: Four times a day (QID) | ORAL | Status: DC | PRN
  Filled 2011-09-11 (×5): qty 1

## 2011-09-11 MED ORDER — CITRIC ACID-SODIUM CITRATE 334-500 MG/5ML PO SOLN
30.0000 mL | Freq: Once | ORAL | Status: AC
  Administered 2011-09-11: 30 mL via ORAL

## 2011-09-11 MED ORDER — CITRIC ACID-SODIUM CITRATE 334-500 MG/5ML PO SOLN
ORAL | Status: AC
  Filled 2011-09-11: qty 15

## 2011-09-11 MED ORDER — PHENYLEPHRINE HCL 10 MG/ML IJ SOLN
INTRAMUSCULAR | Status: DC | PRN
  Administered 2011-09-11 (×2): 40 ug via INTRAVENOUS

## 2011-09-11 MED ORDER — 0.9 % SODIUM CHLORIDE (POUR BTL) OPTIME
TOPICAL | Status: DC | PRN
  Administered 2011-09-11: 1000 mL

## 2011-09-11 MED ORDER — KETOROLAC TROMETHAMINE 30 MG/ML IJ SOLN: 30.0000 mg | Freq: Four times a day (QID) | INTRAMUSCULAR | Status: AC | PRN

## 2011-09-11 MED ORDER — OXYTOCIN 10 UNIT/ML IJ SOLN
INTRAMUSCULAR | Status: AC
  Filled 2011-09-11: qty 2

## 2011-09-11 MED ORDER — LACTATED RINGERS IV SOLN
INTRAVENOUS | Status: DC
  Administered 2011-09-11: 05:00:00 via INTRAVENOUS

## 2011-09-11 MED ORDER — OXYCODONE-ACETAMINOPHEN 5-325 MG PO TABS
1.0000 | ORAL_TABLET | ORAL | Status: DC | PRN
  Administered 2011-09-11: 1 via ORAL
  Administered 2011-09-12: 2 via ORAL
  Administered 2011-09-12: 1 via ORAL
  Administered 2011-09-13: 2 via ORAL
  Administered 2011-09-13: 1 via ORAL
  Filled 2011-09-11: qty 2
  Filled 2011-09-11: qty 1
  Filled 2011-09-11: qty 2
  Filled 2011-09-11 (×2): qty 1
  Filled 2011-09-11: qty 2

## 2011-09-11 MED ORDER — MENTHOL 3 MG MT LOZG: 1.0000 | LOZENGE | OROMUCOSAL | Status: DC | PRN

## 2011-09-11 MED ORDER — MORPHINE SULFATE (PF) 0.5 MG/ML IJ SOLN
INTRAMUSCULAR | Status: DC | PRN
  Administered 2011-09-11: .15 mg via INTRATHECAL

## 2011-09-11 MED ORDER — ONDANSETRON HCL 4 MG/2ML IJ SOLN: 4.0000 mg | Freq: Three times a day (TID) | INTRAMUSCULAR | Status: DC | PRN

## 2011-09-11 MED ORDER — NALBUPHINE HCL 10 MG/ML IJ SOLN
5.0000 mg | INTRAMUSCULAR | Status: DC | PRN
  Filled 2011-09-11: qty 1

## 2011-09-11 MED ORDER — METOCLOPRAMIDE HCL 5 MG/ML IJ SOLN: 10.0000 mg | Freq: Three times a day (TID) | INTRAMUSCULAR | Status: DC | PRN

## 2011-09-11 MED ORDER — FAMOTIDINE IN NACL 20-0.9 MG/50ML-% IV SOLN
INTRAVENOUS | Status: AC
  Filled 2011-09-11: qty 50

## 2011-09-11 MED ORDER — ZOLPIDEM TARTRATE 5 MG PO TABS: 5.0000 mg | ORAL_TABLET | Freq: Every evening | ORAL | Status: DC | PRN

## 2011-09-11 MED ORDER — HYDROMORPHONE HCL PF 1 MG/ML IJ SOLN: 0.2500 mg | INTRAMUSCULAR | Status: DC | PRN

## 2011-09-11 MED ORDER — SCOPOLAMINE 1 MG/3DAYS TD PT72
MEDICATED_PATCH | TRANSDERMAL | Status: AC
  Filled 2011-09-11: qty 1

## 2011-09-11 MED ORDER — BUPIVACAINE HCL (PF) 0.25 % IJ SOLN
INTRAMUSCULAR | Status: DC | PRN
  Administered 2011-09-11: 30 mL

## 2011-09-11 MED ORDER — SODIUM CHLORIDE 0.9 % IJ SOLN
3.0000 mL | INTRAMUSCULAR | Status: DC | PRN
  Administered 2011-09-11: 3 mL via INTRAVENOUS

## 2011-09-11 MED ORDER — FENTANYL CITRATE 0.05 MG/ML IJ SOLN
INTRAMUSCULAR | Status: AC
  Filled 2011-09-11: qty 2

## 2011-09-11 MED ORDER — SIMETHICONE 80 MG PO CHEW: 80.0000 mg | CHEWABLE_TABLET | ORAL | Status: DC | PRN

## 2011-09-11 MED ORDER — ONDANSETRON HCL 4 MG PO TABS: 4.0000 mg | ORAL_TABLET | ORAL | Status: DC | PRN

## 2011-09-11 MED ORDER — DEXTROSE IN LACTATED RINGERS 5 % IV SOLN
INTRAVENOUS | Status: DC
  Administered 2011-09-11: 14:00:00 via INTRAVENOUS

## 2011-09-11 MED ORDER — WITCH HAZEL-GLYCERIN EX PADS: 1.0000 "application " | MEDICATED_PAD | CUTANEOUS | Status: DC | PRN

## 2011-09-11 MED ORDER — DIPHENHYDRAMINE HCL 25 MG PO CAPS: 25.0000 mg | ORAL_CAPSULE | ORAL | Status: DC | PRN

## 2011-09-11 MED ORDER — DIPHENHYDRAMINE HCL 50 MG/ML IJ SOLN: 25.0000 mg | INTRAMUSCULAR | Status: DC | PRN

## 2011-09-11 MED ORDER — PRENATAL MULTIVITAMIN CH
1.0000 | ORAL_TABLET | Freq: Every day | ORAL | Status: DC
  Administered 2011-09-12 – 2011-09-13 (×2): 1 via ORAL
  Filled 2011-09-11 (×2): qty 1

## 2011-09-11 MED ORDER — DIPHENHYDRAMINE HCL 50 MG/ML IJ SOLN
12.5000 mg | INTRAMUSCULAR | Status: DC | PRN
  Administered 2011-09-11: 12.5 mg via INTRAVENOUS
  Filled 2011-09-11: qty 1

## 2011-09-11 MED ORDER — CEFAZOLIN SODIUM-DEXTROSE 2-3 GM-% IV SOLR
2.0000 g | Freq: Once | INTRAVENOUS | Status: AC
  Administered 2011-09-11: 2 g via INTRAVENOUS
  Filled 2011-09-11: qty 50

## 2011-09-11 MED ORDER — LANOLIN HYDROUS EX OINT: 1.0000 "application " | TOPICAL_OINTMENT | CUTANEOUS | Status: DC | PRN

## 2011-09-11 MED ORDER — MEPERIDINE HCL 25 MG/ML IJ SOLN: 6.2500 mg | INTRAMUSCULAR | Status: DC | PRN

## 2011-09-11 MED ORDER — MEASLES, MUMPS & RUBELLA VAC ~~LOC~~ INJ
0.5000 mL | INJECTION | Freq: Once | SUBCUTANEOUS | Status: DC
  Filled 2011-09-11: qty 0.5

## 2011-09-11 MED ORDER — KETOROLAC TROMETHAMINE 60 MG/2ML IM SOLN
INTRAMUSCULAR | Status: AC
  Filled 2011-09-11: qty 2

## 2011-09-11 SURGICAL SUPPLY — 30 items
BENZOIN TINCTURE PRP APPL 2/3 (GAUZE/BANDAGES/DRESSINGS) ×2 IMPLANT
CHLORAPREP W/TINT 26ML (MISCELLANEOUS) ×2 IMPLANT
CLOTH BEACON ORANGE TIMEOUT ST (SAFETY) ×2 IMPLANT
DRESSING TELFA 8X3 (GAUZE/BANDAGES/DRESSINGS) ×2 IMPLANT
ELECT REM PT RETURN 9FT ADLT (ELECTROSURGICAL) ×2
ELECTRODE REM PT RTRN 9FT ADLT (ELECTROSURGICAL) ×1 IMPLANT
EXTRACTOR VACUUM M CUP 4 TUBE (SUCTIONS) IMPLANT
GAUZE SPONGE 4X4 12PLY STRL LF (GAUZE/BANDAGES/DRESSINGS) IMPLANT
GLOVE BIOGEL PI IND STRL 7.0 (GLOVE) ×1 IMPLANT
GLOVE BIOGEL PI INDICATOR 7.0 (GLOVE) ×1
GLOVE ECLIPSE 7.0 STRL STRAW (GLOVE) ×4 IMPLANT
GOWN PREVENTION PLUS LG XLONG (DISPOSABLE) ×4 IMPLANT
GOWN PREVENTION PLUS XLARGE (GOWN DISPOSABLE) ×2 IMPLANT
KIT ABG SYR 3ML LUER SLIP (SYRINGE) IMPLANT
NEEDLE HYPO 22GX1.5 SAFETY (NEEDLE) ×2 IMPLANT
NEEDLE HYPO 25X5/8 SAFETYGLIDE (NEEDLE) IMPLANT
NS IRRIG 1000ML POUR BTL (IV SOLUTION) ×2 IMPLANT
PACK C SECTION WH (CUSTOM PROCEDURE TRAY) ×2 IMPLANT
PAD ABD 7.5X8 STRL (GAUZE/BANDAGES/DRESSINGS) ×2 IMPLANT
RTRCTR C-SECT PINK 25CM LRG (MISCELLANEOUS) ×2 IMPLANT
SLEEVE SCD COMPRESS KNEE MED (MISCELLANEOUS) ×2 IMPLANT
STAPLER VISISTAT 35W (STAPLE) IMPLANT
STRIP CLOSURE SKIN 1/2X4 (GAUZE/BANDAGES/DRESSINGS) ×2 IMPLANT
SUT VIC AB 0 CTX 36 (SUTURE) ×3
SUT VIC AB 0 CTX36XBRD ANBCTRL (SUTURE) ×3 IMPLANT
SUT VIC AB 4-0 KS 27 (SUTURE) IMPLANT
SYR 30ML LL (SYRINGE) ×2 IMPLANT
TOWEL OR 17X24 6PK STRL BLUE (TOWEL DISPOSABLE) ×4 IMPLANT
TRAY FOLEY CATH 14FR (SET/KITS/TRAYS/PACK) ×2 IMPLANT
WATER STERILE IRR 1000ML POUR (IV SOLUTION) IMPLANT

## 2011-09-11 NOTE — Op Note (Signed)
Preoperative Diagnosis:  IUP @ 38 1/7 wk, active labor, previous CS with vertical extension.  Postoperative Diagnosis:  Same  Procedure: Repeat low transverse cesarean section  Surgeon: Tinnie Gens, M.D.  Asst.: None  Findings: Viable female infant, Apgars 9, 9, weight per record, vtx presentation, DOP position  Estimated blood loss: 1000 cc  Complications: None known  Specimens: Placenta to labor and delivery  Reason for procedure: Briefly, the patient is a 26 y.o. [id number] @ [redacted]w[redacted]d with previous 32 wk C-section with vertical extension, who was told not to labor who presents in active labor.  Procedure: Patient is a to the OR where spinal analgesia was administered. She was then placed in a supine position with left lateral tilt. She received 1 g of Ancef and SCDs were in place. A timeout was performed. She was prepped and draped in the usual sterile fashion. A Foley catheter was placed in the bladder. A knife was then used to make a Pfannenstiel incision. This incision was carried out to underlying fascia which was divided in the midline with the knife. The incision was extended laterally,sharply.  The rectus was divided in the midline.  The peritoneal cavity was entered sharply.  Alexis retractor was placed inside the incision. The bladder was high on the uterus and a bladder flap was created.  A knife was used to make a low transverse incision on the uterus. This incision was carried down to the amniotic cavity was entered. Fetus was in DOP position and was brought up out of the incision without difficulty. Cord was clamped x 2 and cut. Infant taken to waiting pediatrician.  Cord blood was obtained. Placenta was delivered from the uterus.  Uterus was cleaned with dry lap pads. Uterine incision closed with 0 Vicryl suture in a locked running fashion. A figure of eight was placed on the incision for hemostasis.  Alexis retractor was removed from the abdomen. Peritoneal closure was done with 0  Vicryl suture.  Fascia is closed with 0 Vicryl suture in a running fashion. Subcutaneous tissue infused with 30cc 0.25% Marcaine.   Skin closed using 3-0 vicryl on a Keith needle.  All instrument, needle and lap counts were correct x 2.  Patient was awake and taken to PACU stable.  Infant to Newborn Nursery, stable.   Nevaen Tredway S 09/11/2011 6:12 AM

## 2011-09-11 NOTE — Anesthesia Postprocedure Evaluation (Signed)
  Anesthesia Post-op Note  Patient: Tami Thomas  Procedure(s) Performed: Procedure(s) (LRB): CESAREAN SECTION (N/A)  Patient Location: PACU and Women's Unit  Anesthesia Type: Spinal  Level of Consciousness: awake, alert  and oriented  Airway and Oxygen Therapy: Patient Spontanous Breathing  Post-op Pain: none  Post-op Assessment: Post-op Vital signs reviewed, Patient's Cardiovascular Status Stable, No headache, No backache, No residual numbness and No residual motor weakness  Post-op Vital Signs: Reviewed and stable  Complications: No apparent anesthesia complications

## 2011-09-11 NOTE — Anesthesia Postprocedure Evaluation (Signed)
Anesthesia Post Note  Patient: Tami Thomas  Procedure(s) Performed: Procedure(s) (LRB): CESAREAN SECTION (N/A)  Anesthesia type: Spinal  Patient location: PACU  Post pain: Pain level controlled  Post assessment: Post-op Vital signs reviewed  Last Vitals:  Filed Vitals:   09/11/11 0815  BP: 102/67  Pulse: 70  Temp: 36.5 C  Resp: 20    Post vital signs: Reviewed  Level of consciousness: awake  Complications: No apparent anesthesia complications

## 2011-09-11 NOTE — Transfer of Care (Signed)
Immediate Anesthesia Transfer of Care Note  Patient: Tami Thomas  Procedure(s) Performed: Procedure(s) (LRB): CESAREAN SECTION (N/A)  Patient Location: PACU  Anesthesia Type: Spinal  Level of Consciousness: awake, alert  and oriented  Airway & Oxygen Therapy: Patient Spontanous Breathing  Post-op Assessment: Report given to PACU RN and Post -op Vital signs reviewed and stable  Post vital signs: Reviewed and stable  Complications: No apparent anesthesia complications

## 2011-09-11 NOTE — Anesthesia Preprocedure Evaluation (Signed)
Anesthesia Evaluation  Patient identified by MRN, date of birth, ID band Patient awake    Reviewed: Allergy & Precautions, H&P , Patient's Chart, lab work & pertinent test results  Airway Mallampati: II TM Distance: >3 FB Neck ROM: full    Dental No notable dental hx.    Pulmonary  breath sounds clear to auscultation  Pulmonary exam normal       Cardiovascular Exercise Tolerance: Good Rhythm:regular Rate:Normal     Neuro/Psych    GI/Hepatic   Endo/Other    Renal/GU      Musculoskeletal   Abdominal   Peds  Hematology   Anesthesia Other Findings   Reproductive/Obstetrics                           Anesthesia Physical Anesthesia Plan  ASA: II and Emergent  Anesthesia Plan: Spinal   Post-op Pain Management:    Induction:   Airway Management Planned:   Additional Equipment:   Intra-op Plan:   Post-operative Plan:   Informed Consent: I have reviewed the patients History and Physical, chart, labs and discussed the procedure including the risks, benefits and alternatives for the proposed anesthesia with the patient or authorized representative who has indicated his/her understanding and acceptance.   Dental Advisory Given  Plan Discussed with: CRNA  Anesthesia Plan Comments: (Lab work confirmed with CRNA in room. Platelets okay. Discussed spinal anesthetic, and patient consents to the procedure:  included risk of possible headache,backache, failed block, allergic reaction, and nerve injury. This patient was asked if she had any questions or concerns before the procedure started. )        Anesthesia Quick Evaluation  

## 2011-09-11 NOTE — H&P (Signed)
Tami Thomas is a 26 y.o. female presenting for labor. Maternal Medical History:  Reason for admission: Reason for admission: contractions.  Reason for Admission:   nauseaContractions: Onset was 3-5 hours ago.   Frequency: regular.   Perceived severity is moderate.    Fetal activity: Perceived fetal activity is normal.   Last perceived fetal movement was within the past hour.    Prenatal complications: Preterm labor.   No bleeding.   Prenatal Complications - Diabetes: none.    OB History    Grav Para Term Preterm Abortions TAB SAB Ect Mult Living   2 1  1      1      Past Medical History  Diagnosis Date  . Anemia   . UTI (lower urinary tract infection)     with last pregnancy  . Chlamydia     2009  . History of preterm delivery, currently pregnant    Past Surgical History  Procedure Date  . Cesarean section    Family History: family history is not on file. Social History:  reports that she has never smoked. She has never used smokeless tobacco. She reports that she does not drink alcohol or use illicit drugs.  Review of Systems  Constitutional: Negative for fever, chills and malaise/fatigue.  Respiratory: Negative for cough and shortness of breath.   Cardiovascular: Negative for chest pain and palpitations.  Gastrointestinal: Negative for nausea, vomiting, diarrhea and constipation.  Genitourinary: Negative for dysuria, urgency and frequency.  Musculoskeletal: Negative for myalgias.  Neurological: Negative for tingling, tremors and seizures.  Psychiatric/Behavioral: Negative for depression.    Dilation: 4.5 Effacement (%): 90 Station: -1 Exam by:: DCALLAWAY, RN Blood pressure 107/73, pulse 88, temperature 97.7 F (36.5 C), temperature source Oral, height 5\' 4"  (1.626 m), weight 125 lb 4 oz (56.813 kg), last menstrual period 12/17/2010. Maternal Exam:  Uterine Assessment: Contraction strength is moderate.  Contraction frequency is regular.   Abdomen: Surgical  scars: low transverse.   Fundal height is term.   Fetal presentation: vertex  Introitus: Normal vulva. Normal vagina.  Ferning test: not done.  Nitrazine test: not done. Amniotic fluid character: not assessed.  Cervix: Cervix evaluated by digital exam.     Physical Exam  Constitutional: She appears well-developed and well-nourished.  HENT:  Head: Normocephalic and atraumatic.  Neck: Neck supple.  Cardiovascular: Normal rate and regular rhythm.   Respiratory: Effort normal.  GI: Soft. There is no tenderness.  Genitourinary:       VE-5cm/100%   FHR- 130's avg. variability Prenatal labs: ABO, Rh: O/Positive/-- (01/04 0000) Antibody: Negative (01/04 0000) Rubella: Immune (01/04 0000) RPR: Nonreactive, Nonreactive, Nonreactive, Borderline, Nonreactive (01/04 0000)  HBsAg: Negative (01/04 0000)  HIV: Non-reactive, Non-reactive, Non-reactive, Non-reactive (01/04 0000)  GBS:     Assessment/Plan: Previous vertical extension of C-Section who needs repeat in active labor.--For repeat C-section, declines permanent sterilization at this time.  Krissy Orebaugh S 09/11/2011, 4:51 AM

## 2011-09-11 NOTE — Addendum Note (Signed)
Addendum  created 09/11/11 1641 by Christene Lye, CRNA   Modules edited:Notes Section

## 2011-09-11 NOTE — Anesthesia Procedure Notes (Signed)

## 2011-09-11 NOTE — MAU Note (Signed)
PT SAYS SHE STARTED HURT  AT 0300- WENT TO B-ROOM- SAW  DARK BLOOD-  WAITED THEN WENT BACK- LESS BLOOD THAN BEFORE.  NO PAD ON IN TRIAGE.  LAST SEX MTHS AGO .  VE IN CLINIC-  2.5 CM

## 2011-09-11 NOTE — Progress Notes (Signed)
UR chart review completed.  

## 2011-09-11 NOTE — Consult Note (Signed)
Neonatology Note:   Attendance at C-section:    I was asked to attend this repeat C/S at term due to onset of labor. The mother is a G2P1 O pos, GBS unknown with onset of labor. ROM at delivery, fluid clear. Infant had decreased tone and was dusky at delivery. He needed  bulb suctioning and vigorous stimulation. Ap 7/9. Lungs clear to ausc in DR. Tone improved to normal by 4-5 minutes and color was good by 3 minutes of life. To CN to care of Pediatrician.   Deatra James, MD

## 2011-09-12 ENCOUNTER — Encounter (HOSPITAL_COMMUNITY): Payer: Self-pay | Admitting: Family Medicine

## 2011-09-12 LAB — CBC
MCH: 29.4 pg (ref 26.0–34.0)
MCHC: 32 g/dL (ref 30.0–36.0)
MCV: 91.9 fL (ref 78.0–100.0)
Platelets: 127 10*3/uL — ABNORMAL LOW (ref 150–400)
RDW: 14.5 % (ref 11.5–15.5)

## 2011-09-12 MED ORDER — DOCUSATE SODIUM 100 MG PO CAPS: 100.0000 mg | ORAL_CAPSULE | Freq: Two times a day (BID) | ORAL | Status: DC | PRN

## 2011-09-12 MED ORDER — FERROUS SULFATE 325 (65 FE) MG PO TABS
325.0000 mg | ORAL_TABLET | Freq: Three times a day (TID) | ORAL | Status: DC
  Administered 2011-09-12 – 2011-09-13 (×3): 325 mg via ORAL
  Filled 2011-09-12 (×3): qty 1

## 2011-09-12 NOTE — Progress Notes (Signed)
Post Partum Day/POD #1 Tami Thomas is a 26 yo G2P1102 at PPD/POD #1 after LTCS   Subjective: no complaints, up ad lib, voiding and tolerating PO Tami Thomas is a 26 yo G2P1102 at PPD/POD #1 after LTCS doing well with no complaints at this time.  Her pain is well controlled with pain medication.  She has minimal lochia.  She notes no drainage with her incision. She is urinating and walking without difficulty.  She denies flatus and BM at this time. She continues to bottle feed.  She plans on using Depo for future family planning.  She will have her son circumcised at one of the clinics after discharge.  Objective: Blood pressure 90/57, pulse 89, temperature 98.1 F (36.7 C), temperature source Oral, resp. rate 18, height 5\' 4"  (1.626 m), weight 56.813 kg (125 lb 4 oz), last menstrual period 12/17/2010, SpO2 98.00%, unknown if currently breastfeeding.  Physical Exam:  General: alert, cooperative, appears stated age and no distress Lochia: appropriate Uterine Fundus: Firm and approximately at umbilicus  Incision: healing well, no significant drainage, no dehiscence DVT Evaluation: No evidence of DVT seen on physical exam. Abdomen: +BS, slight tenderness near incision site, distention noted  Basename 09/12/11 0540 09/11/11 0450  HGB 8.7* 11.3*  HCT 27.2* 35.1*    Assessment/Plan: 26 yo G2P1102 at PPD/POD #1 after LTCS   Continue postpartum care.  Will discuss her exam with OB team.   LOS: 1 day   Mardene Speak 09/12/2011, 8:19 AM

## 2011-09-12 NOTE — Progress Notes (Signed)
Subjective: Postpartum Day 1: Repeat Cesarean Delivery Patient reports incisional pain and tolerating PO. Foley removed this morning. No flatus yet.   Objective: Vital signs in last 24 hours: Temp:  [97.1 F (36.2 C)-98.6 F (37 C)] 98.1 F (36.7 C) (03/14 1610) Pulse Rate:  [69-89] 89  (03/14 0608) Resp:  [16-18] 18  (03/14 0608) BP: (90-101)/(54-68) 90/57 mmHg (03/14 0608) SpO2:  [98 %-100 %] 98 % (03/14 9604)  Physical Exam:  General: alert and no distress Lochia: appropriate Uterine Fundus: firm Abdomen: Mildly distended, good BS in all quadrants, no tenderness Incision: no significant drainage, dressing in place DVT Evaluation: No evidence of DVT seen on physical exam. Negative Homan's sign.  Basename 09/12/11 0540 09/11/11 0450  HGB 8.7* 11.3*  HCT 27.2* 35.1*    Assessment/Plan: Status post Cesarean section. Doing well postoperatively.  Iron therapy for anemia Encouraged ambulation, diet as tolerated Breastfeeding; wants Depo Provera for postpartum BCM Desires outpatient circumcision for her female infant Removed dressing later today in shower Continue routine postpartum care  Tami Thomas A 09/12/2011, 8:21 AM

## 2011-09-12 NOTE — Progress Notes (Signed)
Agree with above. See my note for further details.  Jaynie Collins, M.D. 09/12/2011 9:08 AM

## 2011-09-13 ENCOUNTER — Encounter (HOSPITAL_COMMUNITY): Payer: Self-pay | Admitting: *Deleted

## 2011-09-13 MED ORDER — INTEGRA 62.5-62.5-40-3 MG PO CAPS: 1.0000 | ORAL_CAPSULE | Freq: Every day | ORAL | Status: DC

## 2011-09-13 MED ORDER — IBUPROFEN 600 MG PO TABS: 600.0000 mg | ORAL_TABLET | Freq: Four times a day (QID) | ORAL | Status: AC | PRN

## 2011-09-13 MED ORDER — OXYCODONE-ACETAMINOPHEN 10-325 MG PO TABS: 1.0000 | ORAL_TABLET | Freq: Four times a day (QID) | ORAL | Status: AC | PRN

## 2011-09-13 NOTE — Discharge Instructions (Signed)
Cesarean Delivery  °Cesarean delivery is the birth of a baby through a cut (incision) in the abdomen and womb (uterus).  °LET YOUR CAREGIVER KNOW ABOUT: °· Complications involving the pregnancy.  °· Allergies.  °· Medicines taken including herbs, eyedrops, over-the-counter medicines, and creams.  °· Use of steroids (by mouth or creams).  °· Previous problems with anesthetics or numbing medicine.  °· Previous surgery.  °· History of blood clots.  °· History of bleeding or blood problems.  °· Other health problems.  °RISKS AND COMPLICATIONS  °· Bleeding.  °· Infection.  °· Blood clots.  °· Injury to surrounding organs.  °· Anesthesia problems.  °· Injury to the baby.  °BEFORE THE PROCEDURE  °· A tube (Foley catheter) will be placed in your bladder. The Foley catheter drains the urine from your bladder into a bag. This keeps your bladder empty during surgery.  °· An intravenous access tube (IV) will be placed in your arm.  °· Hair may be removed from your pubic area and your lower abdomen. This is to prevent infection in the incision site.  °· You may be given an antacid medicine to drink. This will prevent acid contents in your stomach from going into your lungs if you vomit during the surgery.  °· You may be given an antibiotic medicine to prevent infection.  °PROCEDURE  °· You may be given medicine to numb the lower half of your body (regional anesthetic). If you were in labor, you may have already had an epidural in place which can be used in both labor and cesarean delivery. You may possibly be given medicine to make you sleep (general anesthetic) though this is not as common.  °· An incision will be made in your abdomen that extends to your uterus. There are 2 basic kinds of incisions:  °· The horizontal (transverse) incision. Horizontal incisions are used for most routine cesarean deliveries.  °· The vertical (up and down) incision. This is less commonly used. This is most often reserved for women who have a  serious complication (extreme prematurity) or under emergency situations.   °· The horizontal and vertical incisions may both be used at the same time. However, this is very uncommon.  °· Your baby will then be delivered.  °AFTER THE PROCEDURE  °· If you were awake during the surgery, you will see your baby right away. If you were asleep, you will see your baby as soon as you are awake.  °· You may breastfeed your baby after surgery.  °· You may be able to get up and walk the same day as the surgery. If you need to stay in bed for a period of time, you will receive help to turn, cough, and take deep breaths after surgery. This helps prevent lung problems such as pneumonia.  °· Do not get out of bed alone the first time after surgery. You will need help getting out of bed until you are able to do this by yourself.  °· You may be able to shower the day after your cesarean delivery.  After the bandage (dressing) is taken off the incision site, a nurse will assist you to shower, if you like.   °· You will have pneumatic compressing hose placed on your feet or lower legs. These hose are used to prevent blood clots. When you are up and walking regularly, they will no longer be necessary.   °· Do not cross your legs when you sit.  °· Save any blood clots that you   pass. If you pass a clot while on the toilet, do not flush it. Call for the nurse. Tell the nurse if you think you are bleeding too much or passing too many clots.   Start drinking liquids and eating food as directed by your caregiver. If your stomach is not ready, drinking and eating too soon can cause an increase in bloating and swelling of your intestine and abdomen. This is very uncomfortable.   You will be given medicine as needed. Let your caregivers know if you are hurting. They want you to be comfortable. You may also be given an antibiotic to prevent an infection.   Your IV will be taken out when you are drinking a reasonable amount of fluids. The  Foley catheter is taken out when you are up and walking.   If your blood type is Rh negative and your baby's blood type is Rh positive, you will be given a shot of anti-D immune globulin. This shot prevents you from having Rh problems with a future pregnancy. You should get the shot even if you had your tubes tied (tubal ligation).   If you are allowed to take the baby for a walk, place the baby in the bassinet and push it. Do not carry your baby in your arms.  Document Released: 06/17/2005 Document Revised: 06/06/2011 Document Reviewed: 10/12/2010 [facility] Patient Information 2012 [place], [place].Cesarean Delivery Care After Refer to this sheet in the next few weeks. These instructions provide you with information on caring for yourself after your procedure. Your caregiver may also give you more specific instructions. Your treatment has been planned according to current medical practices, but problems sometimes occur. Call your caregiver if you have any problems or questions after your procedure. HOME CARE INSTRUCTIONS Healing will take time. You will have discomfort, tenderness, swelling, and bruising at the surgery site for a couple of weeks. This is normal and will get better as time goes on. Activity  Rest as much as possible the first 2 weeks.   When possible, have someone help you with your household activities and your baby for 2 to 3 weeks.   Limit your housework and social activity. Increase your activity gradually as your strength returns.   Do not climb stairs more than 2 to 3 times a day.   Do not lift anything heavier than your baby.   Follow your caregiver's instructions about driving a car.   Exercise only as directed by your caregiver.  Nutrition  You may return to your usual diet. Eat a well-balanced diet.   Drink enough fluid to keep your urine clear or pale yellow.   Keep taking your prenatal or multivitamins.   Do not drink alcohol until your caregiver says it  is okay.  Elimination You should return to your usual bowel function. If you develop constipation, ask your caregiver about taking a mild laxative that will help you go to the bathroom. Bran foods and fluids help with constipation. Gradually add fruit, vegetables, and bran to your diet.  Hygiene  You may shower, wash your hair, and take tub baths unless your caregiver tells you otherwise.   Continue perineal care until your vaginal bleeding and discharge stops.   Do not douche or use tampons until your caregiver says it is okay.  Fever If you feel feverish or have shaking chills, take your temperature. The fever may indicate infection. Infections can be treated with antibiotic medicine. Pain Control and Medicine  Only take over-the-counter or prescription  medicine as directed by your caregiver. Do not take aspirin. It can cause bleeding.   Do not drive when taking pain medicine.   Talk to your caregiver about restarting or adjusting your normal medicines.  Incision Care  Clean your cut (incision) gently with soap and water, then pat dry.   If your caregiver says it is okay, leave the incision without a bandage (dressing) unless it is draining fluid or irritated.   If you have small adhesive strips across the incision and they do not fall off within 7 days, carefully peel them off.   Check the incision daily for increased redness, drainage, swelling, or separation of skin.   Hug a pillow when coughing or sneezing. This helps to relieve pain.  Vaginal Care You may have a vaginal discharge or bleeding for up to 6 weeks. If the vaginal discharge becomes bright red, bad smelling, heavy in amount, has blood clots, or if you have burning or frequent urination, call your caregiver.If your bleeding slows down and then gets heavier, your body is telling you to slow down and relax more. Sexual Intercourse  Check with your caregiver before resuming sexual activity. Often, after 4 to 6 weeks,  if you feel good and are well rested, sexual activity may be resumed. Avoid positions that strain the incision site.   You can become pregnant before you have a period. If you decide to have sexual intercourse, use birth control if you do not want to become pregnant right away.  Health Practices  Keep all your postpartum appointments as recommended by your caregiver. Generally, your caregiver will want to see you in 2 to 3 weeks.   Continue with your yearly pelvic exams.   Continue monthly self-breast exams and yearly physical exams with a Pap test.  Breast Care  If you are not breastfeeding and your breasts become tender, hard, or leak milk, you may wear a tight-fitting bra and apply ice to your breasts.   If you are breastfeeding, wear a good support bra.   Call your caregiver if you have breast pain, flu-like symptoms, fever, or hardness and reddening of your breasts.  Postpartum Blues You may have a period of low spirits or "blues" after your baby is born. Discuss your feelings with your partner, family, and friends. This may be caused by the changing hormone levels in your body. You may want to contact your caregiver if this is worrisome. Miscellaneous  Limit wearing support panties or control-top hose.   If you breastfeed, you may not have a period for several months or longer. This is normal for the nursing mother. If you do not menstruate within 6 weeks after you stop breastfeeding, see your caregiver.   If you are not breastfeeding, you can expect to menstruate within 6 to 10 weeks after birth. If you have not started by the 11th week, check with your caregiver.  SEEK MEDICAL CARE IF:   There is swelling, redness, or increasing pain in the wound area.   You have pus coming from the wound.   You notice a bad smell from the wound or surgical dressing.   You have pain, redness, and swelling from the intravenous (IV) site.   Your wound breaks open (the edges are not staying  together).   You feel dizzy or feel like fainting.   You develop pain or bleeding when you urinate.   You develop diarrhea.   You develop nausea and vomiting.   You develop abnormal vaginal  discharge.   You develop a rash.   You have any type of abnormal reaction or develop an allergy to your medicine.   Your pain is not relieved by your medicine or becomes worse.   Your temperature is 101 F (38.3 C), or is 100.4 F (38 C) taken 2 times in a 4 hour period.  SEEK IMMEDIATE MEDICAL CARE:  You develop a temperature of 102 F (38.9 C) or higher.   You develop abdominal pain.   You develop chest pain.   You develop shortness of breath.   You faint.   You develop pain, swelling, or redness of your leg.   You develop heavy vaginal bleeding with or without blood clots.  Document Released: 03/09/2002 Document Revised: 06/06/2011 Document Reviewed: 09/12/2010 [facility] Patient Information 2012 [place], [place].

## 2011-09-13 NOTE — Discharge Summary (Signed)
Obstetric Discharge Summary Reason for Admission: onset of labor Prenatal Procedures: NST and ultrasound Intrapartum Procedures: cesarean: low cervical, transverse Postpartum Procedures: none Complications-Operative and Postpartum: none Hemoglobin  Date Value Range Status  09/12/2011 8.7* 12.0-15.0 (g/dL) Final     DELTA CHECK NOTED     REPEATED TO VERIFY     HCT  Date Value Range Status  09/12/2011 27.2* 36.0-46.0 (%) Final    Physical Exam:  General: alert, cooperative and no distress Lochia: appropriate Uterine Fundus: firm Incision: healing well, no significant erythema, steri-strips c/d/i DVT Evaluation: No evidence of DVT seen on physical exam.  Discharge Diagnoses: Term Pregnancy-delivered and s/p LTCS  Discharge Information: Date: 09/13/2011 Activity: pelvic rest Diet: routine Medications: Ibuprofen and Percocet Condition: stable Instructions: refer to practice specific booklet Discharge to: home Follow-up Information    Follow up with [facility] in 4 weeks.   Contact information:   [address] [place] [address]          Newborn Data: Live born female  Birth Weight: 6 lb 8.6 oz (2965 g) APGAR: 7, 9  Home with mother.  Tami Thomas E. 09/13/2011, 6:53 AM

## 2011-09-13 NOTE — Progress Notes (Signed)
Subjective: Postpartum Day 2: Cesarean Delivery Patient reports No complaints, pain well controlled with po meds  Objective: Vital signs in last 24 hours: Temp:  [97.6 F (36.4 C)-98.7 F (37.1 C)] 97.6 F (36.4 C) (03/15 0616) Pulse Rate:  [75-90] 75  (03/15 0616) Resp:  [18] 18  (03/15 0616) BP: (97-107)/(62-71) 107/71 mmHg (03/15 1610)  Physical Exam:  General: alert, cooperative and no distress Lochia: appropriate Uterine Fundus: firm Incision: healing well, no significant erythema DVT Evaluation: No evidence of DVT seen on physical exam.   Basename 09/12/11 0540 09/11/11 0450  HGB 8.7* 11.3*  HCT 27.2* 35.1*    Assessment/Plan: Status post Cesarean section. Doing well postoperatively.  Discharge home with standard precautions and return to clinic in 4-6 weeks.  Roseann Kees E. 09/13/2011, 6:55 AM

## 2011-09-13 NOTE — Discharge Summary (Signed)
Attestation of Attending Supervision of Advanced Practitioner: Evaluation and management procedures were performed by the PA/NP/CNM/OB Fellow under my supervision/collaboration. Chart reviewed, and agree with management and plan.  Jaynie Collins, M.D. 09/13/2011 7:07 AM

## 2011-10-09 ENCOUNTER — Ambulatory Visit: Payer: Medicaid Other | Admitting: Advanced Practice Midwife

## 2011-10-30 ENCOUNTER — Ambulatory Visit (INDEPENDENT_AMBULATORY_CARE_PROVIDER_SITE_OTHER): Payer: Medicaid Other | Admitting: Obstetrics and Gynecology

## 2011-10-30 ENCOUNTER — Encounter: Payer: Self-pay | Admitting: Obstetrics and Gynecology

## 2011-10-30 VITALS — BP 143/96 | HR 67 | Temp 98.2°F | Resp 16 | Ht 63.0 in | Wt 107.9 lb

## 2011-10-30 DIAGNOSIS — Z98891 History of uterine scar from previous surgery: Secondary | ICD-10-CM

## 2011-10-30 DIAGNOSIS — Z7251 High risk heterosexual behavior: Secondary | ICD-10-CM

## 2011-10-30 NOTE — Progress Notes (Signed)
Pt had unprotected intercourse 1 week ago.

## 2011-10-30 NOTE — Progress Notes (Signed)
  Subjective:     Tami Thomas is a 26 y.o. female who presents for a postpartum visit. She is 4 weeks postpartum following a low cervical transverse Cesarean section. I have fully reviewed the prenatal and intrapartum course. The delivery was at 39 gestational weeks. Outcome: repeat cesarean section, low transverse incision. Anesthesia: spinal. Postpartum course has been uncomplicated. Baby's course has been uncomplicated. Baby is feeding by bottle. Bleeding no bleeding. Bowel function is normal. Bladder function is normal. Patient is sexually active. Contraception method is none. Postpartum depression screening: negative. Discharge Hgb was 8.7 nd she is on iron and has no orthostatic sx.  The following portions of the patient's history were reviewed and updated as appropriate: allergies, current medications, past family history, past medical history, past social history, past surgical history and problem list.  Review of Systems Pertinent items are noted in HPI.   Objective:    BP 143/96  Pulse 67  Temp(Src) 98.2 F (36.8 C) (Oral)  Resp 16  Ht 5\' 3"  (1.6 m)  Wt 107 lb 14.4 oz (48.943 kg)  BMI 19.11 kg/m2  LMP 10/14/2011  Breastfeeding? Unknown  General:  alert, cooperative, appears stated age and no distress   Breasts:  negative  Lungs: clear to auscultation bilaterally  Heart:  regular rate and rhythm, S1, S2 normal, no murmur, click, rub or gallop  Abdomen: soft, non-tender; bowel sounds normal; no masses,  no organomegaly incision well healed   Vulva:  normal  Vagina: normal vagina  Cervix:  anteverted  Corpus: normal size, contour, position, consistency, mobility, non-tender  Adnexa:  normal adnexa  Rectal Exam: Not performed.        Assessment:    4 wk  postpartum exam. Pap smear not done at today's visit.   Plan:    1. Contraception: Depo-Provera injections 2. UPT today negative. Return in 2 weeks for another urine pregnancy test and to begin Depo-Provera after 2  weeks of abstinence from intercourse. Continue iron for 2 weeks 3. Follow up in: 7 months for Pap or as needed.

## 2011-10-30 NOTE — Patient Instructions (Signed)
Anemia, Nonspecific Your exam and blood tests show you are anemic. This means your blood (hemoglobin) level is low. Normal hemoglobin values are 12 to 15 g/dL for females and 14 to 17 g/dL for males. Make a note of your hemoglobin level today. The hematocrit percent is also used to measure anemia. A normal hematocrit is 38% to 46% in females and 42% to 49% in males. Make a note of your hematocrit level today. CAUSES  Anemia can be due to many different causes.  Excessive bleeding from periods (in women).   Intestinal bleeding.   Poor nutrition.   Kidney, thyroid, liver, and bone marrow diseases.  SYMPTOMS  Anemia can come on suddenly (acute). It can also come on slowly. Symptoms can include:  Minor weakness.   Dizziness.   Palpitations.   Shortness of breath.  Symptoms may be absent until half your hemoglobin is missing if it comes on slowly. Anemia due to acute blood loss from an injury or internal bleeding may require blood transfusion if the loss is severe. Hospital care is needed if you are anemic and there is significant continual blood loss. TREATMENT   Stool tests for blood (Hemoccult) and additional lab tests are often needed. This determines the best treatment.   Further checking on your condition and your response to treatment is very important. It often takes many weeks to correct anemia.  Depending on the cause, treatment can include:  Supplements of iron.   Vitamins B12 and folic acid.   Hormone medicines.If your anemia is due to bleeding, finding the cause of the blood loss is very important. This will help avoid further problems.  SEEK IMMEDIATE MEDICAL CARE IF:   You develop fainting, extreme weakness, shortness of breath, or chest pain.   You develop heavy vaginal bleeding.   You develop bloody or black, tarry stools or vomit up blood.   You develop a high fever, rash, repeated vomiting, or dehydration.  Document Released: 07/25/2004 Document Revised:  06/06/2011 Document Reviewed: 05/02/2009 ExitCare Patient Information 2012 [employer]. 

## 2011-11-13 ENCOUNTER — Ambulatory Visit (INDEPENDENT_AMBULATORY_CARE_PROVIDER_SITE_OTHER): Payer: Medicaid Other | Admitting: *Deleted

## 2011-11-13 VITALS — BP 130/89 | HR 79 | Temp 97.7°F | Ht 64.0 in | Wt 107.0 lb

## 2011-11-13 DIAGNOSIS — Z3049 Encounter for surveillance of other contraceptives: Secondary | ICD-10-CM

## 2011-11-13 MED ORDER — MEDROXYPROGESTERONE ACETATE 150 MG/ML IM SUSP
150.0000 mg | Freq: Once | INTRAMUSCULAR | Status: AC
  Administered 2011-11-13: 150 mg via INTRAMUSCULAR

## 2012-01-29 ENCOUNTER — Ambulatory Visit (INDEPENDENT_AMBULATORY_CARE_PROVIDER_SITE_OTHER): Payer: Medicaid Other | Admitting: General Practice

## 2012-01-29 VITALS — BP 113/75 | HR 86 | Temp 98.0°F | Ht 64.0 in | Wt 102.8 lb

## 2012-01-29 DIAGNOSIS — Z3049 Encounter for surveillance of other contraceptives: Secondary | ICD-10-CM

## 2012-01-29 DIAGNOSIS — Z309 Encounter for contraceptive management, unspecified: Secondary | ICD-10-CM

## 2012-01-29 MED ORDER — MEDROXYPROGESTERONE ACETATE 150 MG/ML IM SUSP
150.0000 mg | INTRAMUSCULAR | Status: DC
  Administered 2012-01-29: 150 mg via INTRAMUSCULAR

## 2012-04-15 ENCOUNTER — Ambulatory Visit (INDEPENDENT_AMBULATORY_CARE_PROVIDER_SITE_OTHER): Payer: Medicaid Other | Admitting: Medical

## 2012-04-15 VITALS — BP 127/90 | HR 85 | Temp 99.0°F | Resp 12

## 2012-04-15 DIAGNOSIS — Z3049 Encounter for surveillance of other contraceptives: Secondary | ICD-10-CM

## 2012-04-15 LAB — POCT PREGNANCY, URINE: Preg Test, Ur: NEGATIVE

## 2012-04-15 MED ORDER — MEDROXYPROGESTERONE ACETATE 150 MG/ML IM SUSP
150.0000 mg | INTRAMUSCULAR | Status: DC
  Administered 2012-04-15: 150 mg via INTRAMUSCULAR

## 2012-04-15 NOTE — Progress Notes (Signed)
Patient ID: Tami Thomas, female   DOB: [birth year], 26 y.o.   MRN: [id number]  Patient presents today for Depo Provera injection. She is on-time for the injection. She states that she has had one episode of milky nipple discharge recently as well as some mild HA, which she describes more as a lightheadedness than pain. The patient states that she has recently had two periods about 2 weeks apart. She desired a pregnancy test, which was completed and negative today. Depo provera given. Patient was asked to set up appointment to see a provider if she would like to further discuss the nipple discharge and "HA". The patient voiced understanding.

## 2012-07-02 ENCOUNTER — Ambulatory Visit: Payer: Medicaid Other

## 2012-07-10 ENCOUNTER — Ambulatory Visit: Payer: Medicaid Other

## 2012-09-07 ENCOUNTER — Ambulatory Visit (INDEPENDENT_AMBULATORY_CARE_PROVIDER_SITE_OTHER): Payer: Medicaid Other

## 2012-09-07 LAB — POCT PREGNANCY, URINE
Preg Test, Ur: NEGATIVE
Preg Test, Ur: NEGATIVE

## 2012-09-07 NOTE — Progress Notes (Signed)
Pt came in for Depo Provera but was outside the 20 weeks of being late.  Last administration was 04/15/12.  Pregnancy test resulted negative and was advised to abstain from or have protected intercourse for the next two weeks.  Appt made for pt two weeks in which another pregnancy test will be done and she will have a pap smear. Pt stated understanding and did not have any further questions.

## 2012-09-23 ENCOUNTER — Ambulatory Visit: Payer: Medicaid Other | Admitting: Advanced Practice Midwife

## 2012-10-07 ENCOUNTER — Encounter: Payer: Self-pay | Admitting: Advanced Practice Midwife

## 2012-10-07 ENCOUNTER — Other Ambulatory Visit (HOSPITAL_COMMUNITY)
Admission: RE | Admit: 2012-10-07 | Discharge: 2012-10-07 | Disposition: A | Discharge status: Home / Self Care | Payer: Medicaid Other | Source: Ambulatory Visit | Attending: Obstetrics & Gynecology | Admitting: Obstetrics & Gynecology

## 2012-10-07 ENCOUNTER — Ambulatory Visit (INDEPENDENT_AMBULATORY_CARE_PROVIDER_SITE_OTHER): Payer: Medicaid Other | Admitting: Advanced Practice Midwife

## 2012-10-07 VITALS — BP 114/73 | HR 92 | Temp 97.0°F | Ht 64.0 in | Wt 105.0 lb

## 2012-10-07 DIAGNOSIS — Z3200 Encounter for pregnancy test, result unknown: Secondary | ICD-10-CM

## 2012-10-07 DIAGNOSIS — O09891 Supervision of other high risk pregnancies, first trimester: Secondary | ICD-10-CM

## 2012-10-07 DIAGNOSIS — O09219 Supervision of pregnancy with history of pre-term labor, unspecified trimester: Secondary | ICD-10-CM

## 2012-10-07 DIAGNOSIS — Z3201 Encounter for pregnancy test, result positive: Secondary | ICD-10-CM

## 2012-10-07 DIAGNOSIS — Z01419 Encounter for gynecological examination (general) (routine) without abnormal findings: Secondary | ICD-10-CM

## 2012-10-07 LAB — POCT PREGNANCY, URINE: Preg Test, Ur: POSITIVE — AB

## 2012-10-07 NOTE — Patient Instructions (Signed)
Pregnancy - First Trimester During sexual intercourse, millions of sperm go into the vagina. Only 1 sperm will penetrate and fertilize the female egg while it is in the Fallopian tube. One week later, the fertilized egg implants into the wall of the uterus. An embryo begins to develop into a baby. At 6 to 8 weeks, the eyes and face are formed and the heartbeat can be seen on ultrasound. At the end of 12 weeks (first trimester), all the baby's organs are formed. Now that you are pregnant, you will want to do everything you can to have a healthy baby. Two of the most important things are to get good prenatal care and follow your caregiver's instructions. Prenatal care is all the medical care you receive before the baby's birth. It is given to prevent, find, and treat problems during the pregnancy and childbirth. PRENATAL EXAMS  During prenatal visits, your weight, blood pressure and urine are checked. This is done to make sure you are healthy and progressing normally during the pregnancy.  A pregnant woman should gain 25 to 35 pounds during the pregnancy. However, if you are over weight or underweight, your caregiver will advise you regarding your weight.  Your caregiver will ask and answer questions for you.  Blood work, cervical cultures, other necessary tests and a Pap test are done during your prenatal exams. These tests are done to check on your health and the probable health of your baby. Tests are strongly recommended and done for HIV with your permission. This is the virus that causes AIDS. These tests are done because medications can be given to help prevent your baby from being born with this infection should you have been infected without knowing it. Blood work is also used to find out your blood type, previous infections and follow your blood levels (hemoglobin).  Low hemoglobin (anemia) is common during pregnancy. Iron and vitamins are given to help prevent this. Later in the pregnancy, blood  tests for diabetes will be done along with any other tests if any problems develop. You may need tests to make sure you and the baby are doing well.  You may need other tests to make sure you and the baby are doing well. CHANGES DURING THE FIRST TRIMESTER (THE FIRST 3 MONTHS OF PREGNANCY) Your body goes through many changes during pregnancy. They vary from person to person. Talk to your caregiver about changes you notice and are concerned about. Changes can include:  Your menstrual period stops.  The egg and sperm carry the genes that determine what you look like. Genes from you and your partner are forming a baby. The female genes determine whether the baby is a boy or a girl.  Your body increases in girth and you may feel bloated.  Feeling sick to your stomach (nauseous) and throwing up (vomiting). If the vomiting is uncontrollable, call your caregiver.  Your breasts will begin to enlarge and become tender.  Your nipples may stick out more and become darker.  The need to urinate more. Painful urination may mean you have a bladder infection.  Tiring easily.  Loss of appetite.  Cravings for certain kinds of food.  At first, you may gain or lose a couple of pounds.  You may have changes in your emotions from day to day (excited to be pregnant or concerned something may go wrong with the pregnancy and baby).  You may have more vivid and strange dreams. HOME CARE INSTRUCTIONS   It is very important   to avoid all smoking, alcohol and un-prescribed drugs during your pregnancy. These affect the formation and growth of the baby. Avoid chemicals while pregnant to ensure the delivery of a healthy infant.  Start your prenatal visits by the 12th week of pregnancy. They are usually scheduled monthly at first, then more often in the last 2 months before delivery. Keep your caregiver's appointments. Follow your caregiver's instructions regarding medication use, blood and lab tests, exercise, and  diet.  During pregnancy, you are providing food for you and your baby. Eat regular, well-balanced meals. Choose foods such as meat, fish, milk and other low fat dairy products, vegetables, fruits, and whole-grain breads and cereals. Your caregiver will tell you of the ideal weight gain.  You can help morning sickness by keeping soda crackers at the bedside. Eat a couple before arising in the morning. You may want to use the crackers without salt on them.  Eating 4 to 5 small meals rather than 3 large meals a day also may help the nausea and vomiting.  Drinking liquids between meals instead of during meals also seems to help nausea and vomiting.  A physical sexual relationship may be continued throughout pregnancy if there are no other problems. Problems may be early (premature) leaking of amniotic fluid from the membranes, vaginal bleeding, or belly (abdominal) pain.  Exercise regularly if there are no restrictions. Check with your caregiver or physical therapist if you are unsure of the safety of some of your exercises. Greater weight gain will occur in the last 2 trimesters of pregnancy. Exercising will help:  Control your weight.  Keep you in shape.  Prepare you for labor and delivery.  Help you lose your pregnancy weight after you deliver your baby.  Wear a good support or jogging bra for breast tenderness during pregnancy. This may help if worn during sleep too.  Ask when prenatal classes are available. Begin classes when they are offered.  Do not use hot tubs, steam rooms or saunas.  Wear your seat belt when driving. This protects you and your baby if you are in an accident.  Avoid raw meat, uncooked cheese, cat litter boxes and soil used by cats throughout the pregnancy. These carry germs that can cause birth defects in the baby.  The first trimester is a good time to visit your dentist for your dental health. Getting your teeth cleaned is OK. Use a softer toothbrush and brush  gently during pregnancy.  Ask for help if you have financial, counseling or nutritional needs during pregnancy. Your caregiver will be able to offer counseling for these needs as well as refer you for other special needs.  Do not take any medications or herbs unless told by your caregiver.  Inform your caregiver if there is any mental or physical domestic violence.  Make a list of emergency phone numbers of family, friends, hospital, and police and fire departments.  Write down your questions. Take them to your prenatal visit.  Do not douche.  Do not cross your legs.  If you have to stand for long periods of time, rotate you feet or take small steps in a circle.  You may have more vaginal secretions that may require a sanitary pad. Do not use tampons or scented sanitary pads. MEDICATIONS AND DRUG USE IN PREGNANCY  Take prenatal vitamins as directed. The vitamin should contain 1 milligram of folic acid. Keep all vitamins out of reach of children. Only a couple vitamins or tablets containing iron may be   fatal to a baby or young child when ingested.  Avoid use of all medications, including herbs, over-the-counter medications, not prescribed or suggested by your caregiver. Only take over-the-counter or prescription medicines for pain, discomfort, or fever as directed by your caregiver. Do not use aspirin, ibuprofen, or naproxen unless directed by your caregiver.  Let your caregiver also know about herbs you may be using.  Alcohol is related to a number of birth defects. This includes fetal alcohol syndrome. All alcohol, in any form, should be avoided completely. Smoking will cause low birth rate and premature babies.  Street or illegal drugs are very harmful to the baby. They are absolutely forbidden. A baby born to an addicted mother will be addicted at birth. The baby will go through the same withdrawal an adult does.  Let your caregiver know about any medications that you have to take  and for what reason you take them. MISCARRIAGE IS COMMON DURING PREGNANCY A miscarriage does not mean you did something wrong. It is not a reason to worry about getting pregnant again. Your caregiver will help you with questions you may have. If you have a miscarriage, you may need minor surgery. SEEK MEDICAL CARE IF:  You have any concerns or worries during your pregnancy. It is better to call with your questions if you feel they cannot wait, rather than worry about them. SEEK IMMEDIATE MEDICAL CARE IF:   An unexplained oral temperature above 102 F (38.9 C) develops, or as your caregiver suggests.  You have leaking of fluid from the vagina (birth canal). If leaking membranes are suspected, take your temperature and inform your caregiver of this when you call.  There is vaginal spotting or bleeding. Notify your caregiver of the amount and how many pads are used.  You develop a bad smelling vaginal discharge with a change in the color.  You continue to feel sick to your stomach (nauseated) and have no relief from remedies suggested. You vomit blood or coffee ground-like materials.  You lose more than 2 pounds of weight in 1 week.  You gain more than 2 pounds of weight in 1 week and you notice swelling of your face, hands, feet, or legs.  You gain 5 pounds or more in 1 week (even if you do not have swelling of your hands, face, legs, or feet).  You get exposed to German measles and have never had them.  You are exposed to fifth disease or chickenpox.  You develop belly (abdominal) pain. Round ligament discomfort is a common non-cancerous (benign) cause of abdominal pain in pregnancy. Your caregiver still must evaluate this.  You develop headache, fever, diarrhea, pain with urination, or shortness of breath.  You fall or are in a car accident or have any kind of trauma.  There is mental or physical violence in your home. Document Released: 06/11/2001 Document Revised: 09/09/2011  Document Reviewed: 12/13/2008 Exit[employer] Patient Information 2013 [employer].  

## 2012-10-07 NOTE — Progress Notes (Signed)
Tami Thomas is a 27 y.o. G2P1102 here for annual and to restart depo, no birth control for the last several months.  Patient's last menstrual period was 08/29/2012. regular periods prior to this. No c/o today, no pain or bleeding.  OB History   Grav Para Term Preterm Abortions TAB SAB Ect Mult Living   2 2 1 1  0 0 0 0 0 2      Past Medical History  Diagnosis Date  . Anemia   . UTI (lower urinary tract infection)     with last pregnancy  . Chlamydia     2009  . History of preterm delivery, currently pregnant    No Known Allergies  Review of Systems  Constitutional: Negative.   Respiratory: Negative.   Cardiovascular: Negative.   Gastrointestinal: Negative for nausea, vomiting, abdominal pain, diarrhea and constipation.  Genitourinary: Negative for dysuria, urgency, frequency, hematuria and flank pain.       Negative for vaginal bleeding, vaginal discharge, dyspareunia  Musculoskeletal: Negative.   Neurological: Negative.   Psychiatric/Behavioral: Negative.    Physical Exam  Nursing note and vitals reviewed. Constitutional: She is oriented to person, place, and time. She appears well-developed and well-nourished. No distress.  Neck: Normal range of motion. Neck supple. No thyromegaly present.  Cardiovascular: Normal rate.   Pulmonary/Chest: Effort normal. Right breast exhibits no inverted nipple, no mass, no nipple discharge, no skin change and no tenderness. Left breast exhibits no inverted nipple, no mass, no nipple discharge, no skin change and no tenderness. Breasts are symmetrical. There is no breast swelling.  Abdominal: Soft. There is no tenderness.  Genitourinary: Vagina normal and uterus normal. No breast bleeding. Cervix exhibits no motion tenderness, no discharge and no friability. Right adnexum displays no mass, no tenderness and no fullness. Left adnexum displays no mass, no tenderness and no fullness. No tenderness or bleeding around the vagina. No vaginal  discharge found.  Musculoskeletal: Normal range of motion.  Neurological: She is alert and oriented to person, place, and time.  Skin: Skin is warm and dry.  Psychiatric: She has a normal mood and affect.   Labs: UPT Positive today Pap and GC/CT collected   Assessment/Plan: 27 y.o. G2P1102 at 5.[redacted] weeks EGA - well woman exam, incidental finding of pregnancy Pap and GC done today Pt has history of preterm delivery, will start prenatal care in [facility] as soon as possible Precautions rev'd

## 2012-11-05 ENCOUNTER — Encounter: Payer: Medicaid Other | Admitting: Family Medicine

## 2013-03-16 ENCOUNTER — Ambulatory Visit (INDEPENDENT_AMBULATORY_CARE_PROVIDER_SITE_OTHER): Payer: Medicaid Other | Admitting: Obstetrics and Gynecology

## 2013-03-16 ENCOUNTER — Encounter: Payer: Self-pay | Admitting: Obstetrics and Gynecology

## 2013-03-16 VITALS — BP 102/70 | Temp 98.1°F | Wt 112.0 lb

## 2013-03-16 DIAGNOSIS — O0933 Supervision of pregnancy with insufficient antenatal care, third trimester: Secondary | ICD-10-CM | POA: Insufficient documentation

## 2013-03-16 DIAGNOSIS — Z3689 Encounter for other specified antenatal screening: Secondary | ICD-10-CM

## 2013-03-16 DIAGNOSIS — O09213 Supervision of pregnancy with history of pre-term labor, third trimester: Secondary | ICD-10-CM

## 2013-03-16 DIAGNOSIS — O34219 Maternal care for unspecified type scar from previous cesarean delivery: Secondary | ICD-10-CM

## 2013-03-16 DIAGNOSIS — O09219 Supervision of pregnancy with history of pre-term labor, unspecified trimester: Secondary | ICD-10-CM

## 2013-03-16 DIAGNOSIS — Z3492 Encounter for supervision of normal pregnancy, unspecified, second trimester: Secondary | ICD-10-CM

## 2013-03-16 DIAGNOSIS — O0930 Supervision of pregnancy with insufficient antenatal care, unspecified trimester: Secondary | ICD-10-CM

## 2013-03-16 DIAGNOSIS — O09893 Supervision of other high risk pregnancies, third trimester: Secondary | ICD-10-CM

## 2013-03-16 DIAGNOSIS — Z23 Encounter for immunization: Secondary | ICD-10-CM

## 2013-03-16 LAB — POCT URINALYSIS DIP (DEVICE)
Bilirubin Urine: NEGATIVE
Glucose, UA: NEGATIVE mg/dL
Hgb urine dipstick: NEGATIVE
Nitrite: NEGATIVE
Urobilinogen, UA: 0.2 mg/dL (ref 0.0–1.0)

## 2013-03-16 MED ORDER — PROGESTERONE MICRONIZED 100 MG PO CAPS: 200.0000 mg | ORAL_CAPSULE | Freq: Every day | ORAL | Status: DC

## 2013-03-16 MED ORDER — TETANUS-DIPHTH-ACELL PERTUSSIS 5-2.5-18.5 LF-MCG/0.5 IM SUSP
0.5000 mL | Freq: Once | INTRAMUSCULAR | Status: AC
  Administered 2013-03-16: 0.5 mL via INTRAMUSCULAR

## 2013-03-16 MED ORDER — PROMETHAZINE HCL 50 MG RE SUPP: 200.0000 mg | RECTAL | Status: DC

## 2013-03-16 MED ORDER — PRENATAL PLUS 27-1 MG PO TABS: 1.0000 | ORAL_TABLET | Freq: Every day | ORAL | Status: DC

## 2013-03-16 NOTE — Progress Notes (Signed)
Subjective:    Tami Thomas is a [id number] [redacted]w[redacted]d by unsure LMP being seen today for her first obstetrical visit.  Her obstetrical history is significant for hx SOL and PTB at 32 wks, short cx in subsequent pregnancy tx'd prometrium; C/S x2, late to care. . [name] Pregnancy history fully reviewed. Normal Pap 09/2012.  Patient reports no complaints and at times pelvic pressure. No [place]. Works standing PT. [employer] Vitals:   03/16/13 0916  BP: 102/70  Temp: 98.1 F (36.7 C)  Weight: 112 lb (50.803 kg)    HISTORY: OB History  Gravida Para Term Preterm AB SAB TAB Ectopic Multiple Living  3 2 1 1  0 0 0 0 0 2    # Outcome Date GA Lbr Len/2nd Weight Sex Delivery Anes PTL Lv  3 CUR           2 TRM 09/11/11 [redacted]w[redacted]d  6 lb 8.6 oz (2.965 kg) M LTCS Spinal    1 PRE 09/18/07 [redacted]w[redacted]d 72:00 3 lb 10 oz (1.644 kg) M LTCS        Comments: Delivered [facility] at 32 weeks     Past Medical History  Diagnosis Date  . Anemia   . UTI (lower urinary tract infection)     with last pregnancy  . Chlamydia     2009  . History of preterm delivery, currently pregnant    Past Surgical History  Procedure Laterality Date  . Cesarean section  09/18/2007  . Cesarean section  09/11/2011    Procedure: CESAREAN SECTION;  Surgeon: Reva Bores, MD;  Location: [facility];  Service: Gynecology;  Laterality: N/A;  Repeat Cesarean Section Delivery Baby  Boy @ [phone], Apgars 7/9   Family History  Problem Relation Age of Onset  . Hypertension Maternal Grandmother      Exam    Uterus:     Pelvic Exam:    Perineum: No Hemorrhoids, Normal Perineum   Vulva: normal, Bartholin's, Urethra, Skene's normal   Vagina:  normal mucosa, normal discharge       Cervix: posterior soft 1/40-50/-3   Adnexa: not evaluated   Bony Pelvis: average  System: Breast:  normal appearance, no masses or tenderness, Inspection negative   Skin: normal coloration and turgor, no rashes    Neurologic: oriented,  normal, grossly non-focal   Extremities: normal strength, tone, and muscle mass, no deformities   HEENT PERRLA and extra ocular movement intact   Mouth/Teeth mucous membranes moist, pharynx normal without lesions   Neck supple and no masses   Cardiovascular: regular rate and rhythm, no murmurs or gallops   Respiratory:  appears well, vitals normal, no respiratory distress, acyanotic, normal RR, ear and throat exam is normal, neck free of mass or lymphadenopathy, chest clear, no wheezing, crepitations, rhonchi, normal symmetric air entry   Abdomen: 26 cm FH, DT 144   Urinary: urethral meatus normal      Assessment:    Pregnancy: [id number] Patient Active Problem List   Diagnosis Date Noted  . Insufficient prenatal care in third trimester 03/16/2013  . Previous cesarean section complicating pregnancy 03/16/2013  . History of preterm delivery, currently pregnant 10/09/2012  . H/O: cesarean section 06/20/2011        Plan:    C/W Dr. Penne Lash MAU after 3 pm [place] tomorrow for fFN.\ Rx Prometrium 200mg  PV qd Initial labs drawn. Prenatal vitamins. Problem list reviewed and updated. Genetic Screening discussed Quad Screen: declined.  Ultrasound  discussed; fetal survey: scheduled for tomorrow.  Follow up in 2 weeks.HRC 50% of 30 min visit spent on counseling and coordination of care.    Tami Thomas 03/16/2013

## 2013-03-16 NOTE — Progress Notes (Signed)
Pulse-  93 Patient reports lower back pain and pelvic pressure

## 2013-03-16 NOTE — Patient Instructions (Signed)

## 2013-03-17 ENCOUNTER — Other Ambulatory Visit: Payer: Self-pay | Admitting: Obstetrics and Gynecology

## 2013-03-17 ENCOUNTER — Ambulatory Visit (HOSPITAL_COMMUNITY)
Admission: RE | Admit: 2013-03-17 | Discharge: 2013-03-17 | Disposition: A | Discharge status: Home / Self Care | Payer: Medicaid Other | Source: Ambulatory Visit | Attending: Obstetrics and Gynecology | Admitting: Obstetrics and Gynecology

## 2013-03-17 ENCOUNTER — Inpatient Hospital Stay (HOSPITAL_COMMUNITY)
Admission: AD | Admit: 2013-03-17 | Discharge: 2013-03-17 | Disposition: A | Discharge status: Home / Self Care | Payer: Medicaid Other | Source: Ambulatory Visit | Attending: Obstetrics & Gynecology | Admitting: Obstetrics & Gynecology

## 2013-03-17 ENCOUNTER — Encounter: Payer: Self-pay | Admitting: *Deleted

## 2013-03-17 ENCOUNTER — Encounter (HOSPITAL_COMMUNITY): Payer: Self-pay

## 2013-03-17 ENCOUNTER — Telehealth: Payer: Self-pay | Admitting: Advanced Practice Midwife

## 2013-03-17 DIAGNOSIS — Z23 Encounter for immunization: Secondary | ICD-10-CM

## 2013-03-17 DIAGNOSIS — Z3689 Encounter for other specified antenatal screening: Secondary | ICD-10-CM

## 2013-03-17 DIAGNOSIS — Z3492 Encounter for supervision of normal pregnancy, unspecified, second trimester: Secondary | ICD-10-CM

## 2013-03-17 DIAGNOSIS — O99891 Other specified diseases and conditions complicating pregnancy: Secondary | ICD-10-CM | POA: Insufficient documentation

## 2013-03-17 DIAGNOSIS — O34219 Maternal care for unspecified type scar from previous cesarean delivery: Secondary | ICD-10-CM | POA: Insufficient documentation

## 2013-03-17 DIAGNOSIS — Z8751 Personal history of pre-term labor: Secondary | ICD-10-CM | POA: Insufficient documentation

## 2013-03-17 LAB — OBSTETRIC PANEL
Basophils Relative: 0 % (ref 0–1)
Eosinophils Absolute: 0.1 10*3/uL (ref 0.0–0.7)
Hemoglobin: 10.7 g/dL — ABNORMAL LOW (ref 12.0–15.0)
Hepatitis B Surface Ag: NEGATIVE
Lymphs Abs: 0.9 10*3/uL (ref 0.7–4.0)
MCH: 28.7 pg (ref 26.0–34.0)
Monocytes Relative: 12 % (ref 3–12)
Neutro Abs: 4.9 10*3/uL (ref 1.7–7.7)
Neutrophils Relative %: 73 % (ref 43–77)
Platelets: 187 10*3/uL (ref 150–400)
RBC: 3.73 MIL/uL — ABNORMAL LOW (ref 3.87–5.11)
Rh Type: POSITIVE

## 2013-03-17 NOTE — Progress Notes (Signed)
Lisa leftwich-kirby cnm notified of patient. Patient states that she is not is having discomfort or complaints. She had a anatomy ultrasound today and was told that she should come to MAU. Carolee Rota states that she spoke with u/s dept to verify. She was told that patient had a normal ultrasound with cervical length >3cm. Misty Stanley cnm will come to MAU and speak with patient.

## 2013-03-17 NOTE — Telephone Encounter (Signed)
Pt sent to MAU from U/S following detail scan today.  U/S WNL.  Pt left MAU before CNM could present her normal results.  Attempted to call pt but phone number not valid. Pt has scheduled [facility] appt 04/01/13.

## 2013-03-17 NOTE — MAU Note (Signed)
Patient states she had her first OB appointment yesterday at the [facility]. Was scheduled for an ultrasound today and was told to come to MAU after the ultrasound but she is not sure why. Denies bleeding, leaking or contractions. Reports good fetal movement. Was 1cm in the clinic yesterday per patient.

## 2013-03-18 LAB — HEMOGLOBINOPATHY EVALUATION
Hemoglobin Other: 0 %
Hgb A2 Quant: 3 % (ref 2.2–3.2)
Hgb A: 97 % (ref 96.8–97.8)
Hgb S Quant: 0 %

## 2013-03-20 LAB — CULTURE, OB URINE

## 2013-03-26 ENCOUNTER — Telehealth: Payer: Self-pay | Admitting: *Deleted

## 2013-03-26 DIAGNOSIS — N390 Urinary tract infection, site not specified: Secondary | ICD-10-CM

## 2013-03-26 MED ORDER — AMOXICILLIN 500 MG PO CAPS: 500.0000 mg | ORAL_CAPSULE | Freq: Three times a day (TID) | ORAL | Status: DC

## 2013-03-26 NOTE — Telephone Encounter (Signed)
Message copied by Mannie Stabile on Fri Mar 26, 2013 12:18 PM ------      Message from: POE, Claudia Desanctis      Created: Tue Mar 23, 2013  2:57 PM       Please call in rx Amox 500 tid x 10 d for UTI ------

## 2013-03-26 NOTE — Telephone Encounter (Signed)
Patients number invalid, made note to inform her at next visit.

## 2013-04-01 ENCOUNTER — Encounter: Payer: Self-pay | Admitting: Advanced Practice Midwife

## 2013-04-01 ENCOUNTER — Ambulatory Visit (INDEPENDENT_AMBULATORY_CARE_PROVIDER_SITE_OTHER): Payer: Medicaid Other | Admitting: Advanced Practice Midwife

## 2013-04-01 ENCOUNTER — Encounter: Payer: Self-pay | Admitting: *Deleted

## 2013-04-01 ENCOUNTER — Encounter: Payer: Self-pay | Admitting: Obstetrics and Gynecology

## 2013-04-01 VITALS — BP 110/75 | Wt 116.3 lb

## 2013-04-01 DIAGNOSIS — O09219 Supervision of pregnancy with history of pre-term labor, unspecified trimester: Secondary | ICD-10-CM

## 2013-04-01 DIAGNOSIS — O0933 Supervision of pregnancy with insufficient antenatal care, third trimester: Secondary | ICD-10-CM

## 2013-04-01 DIAGNOSIS — O0930 Supervision of pregnancy with insufficient antenatal care, unspecified trimester: Secondary | ICD-10-CM

## 2013-04-01 DIAGNOSIS — O34219 Maternal care for unspecified type scar from previous cesarean delivery: Secondary | ICD-10-CM

## 2013-04-01 DIAGNOSIS — Z23 Encounter for immunization: Secondary | ICD-10-CM

## 2013-04-01 LAB — POCT URINALYSIS DIP (DEVICE)
Bilirubin Urine: NEGATIVE
Glucose, UA: NEGATIVE mg/dL
Ketones, ur: NEGATIVE mg/dL
Specific Gravity, Urine: 1.02 (ref 1.005–1.030)
pH: 7 (ref 5.0–8.0)

## 2013-04-01 MED ORDER — INFLUENZA VIRUS VACC SPLIT PF IM SUSP
0.5000 mL | Freq: Once | INTRAMUSCULAR | Status: AC
  Administered 2013-04-01: 0.5 mL via INTRAMUSCULAR

## 2013-04-01 MED ORDER — PRENATAL VITAMINS 28-0.8 MG PO TABS: 1.0000 | ORAL_TABLET | Freq: Every day | ORAL | Status: DC

## 2013-04-01 NOTE — Progress Notes (Signed)
Pulse: 86 Pt wants a different PNV. She is currently taking the PrePlus27-1mg . When she takes them she gets headaches

## 2013-04-01 NOTE — Progress Notes (Signed)
Doing well.  Good fetal movement, denies vaginal bleeding, LOF, regular contractions. Switched pt to another brand of prenatal vitamin r/t headaches.  Prometrium vaginally Q HS for hx preterm labor, she used this treatment in last pregnancy, and had term vaginal delivery.

## 2013-04-15 ENCOUNTER — Ambulatory Visit (INDEPENDENT_AMBULATORY_CARE_PROVIDER_SITE_OTHER): Payer: Medicaid Other | Admitting: Family Medicine

## 2013-04-15 ENCOUNTER — Encounter: Payer: Self-pay | Admitting: Family Medicine

## 2013-04-15 VITALS — BP 102/70 | Temp 97.7°F | Wt 118.2 lb

## 2013-04-15 DIAGNOSIS — O0933 Supervision of pregnancy with insufficient antenatal care, third trimester: Secondary | ICD-10-CM

## 2013-04-15 DIAGNOSIS — O0930 Supervision of pregnancy with insufficient antenatal care, unspecified trimester: Secondary | ICD-10-CM

## 2013-04-15 LAB — POCT URINALYSIS DIP (DEVICE)
Bilirubin Urine: NEGATIVE
Hgb urine dipstick: NEGATIVE
Ketones, ur: NEGATIVE mg/dL
Protein, ur: NEGATIVE mg/dL
Specific Gravity, Urine: 1.02 (ref 1.005–1.030)
pH: 7.5 (ref 5.0–8.0)

## 2013-04-15 NOTE — Progress Notes (Signed)
Pulse-  89  Pressure-lower abd/vaginal  Had an allergic rxn to lobster- sores left on lip

## 2013-04-15 NOTE — Patient Instructions (Signed)
Pregnancy - Third Trimester The third trimester of pregnancy (the last 3 months) is a period of the most rapid growth for you and your baby. The baby approaches a length of 20 inches and a weight of 6 to 10 pounds. The baby is adding on fat and getting ready for life outside your body. While inside, babies have periods of sleeping and waking, sucking thumbs, and hiccuping. You can often feel small contractions of the uterus. This is false labor. It is also called Braxton-Hicks contractions. This is like a practice for labor. The usual problems in this stage of pregnancy include more difficulty breathing, swelling of the hands and feet from water retention, and having to urinate more often because of the uterus and baby pressing on your bladder.  PRENATAL EXAMS  Blood work may continue to be done during prenatal exams. These tests are done to check on your health and the probable health of your baby. Blood work is used to follow your blood levels (hemoglobin). Anemia (low hemoglobin) is common during pregnancy. Iron and vitamins are given to help prevent this. You may also continue to be checked for diabetes. Some of the past blood tests may be done again.  The size of the uterus is measured during each visit. This makes sure your baby is growing properly according to your pregnancy dates.  Your blood pressure is checked every prenatal visit. This is to make sure you are not getting toxemia.  Your urine is checked every prenatal visit for infection, diabetes, and protein.  Your weight is checked at each visit. This is done to make sure gains are happening at the suggested rate and that you and your baby are growing normally.  Sometimes, an ultrasound is performed to confirm the position and the proper growth and development of the baby. This is a test done that bounces harmless sound waves off the baby so your caregiver can more accurately determine a due date.  Discuss the type of pain medicine and  anesthesia you will have during your labor and delivery.  Discuss the possibility and anesthesia if a cesarean section might be necessary.  Inform your caregiver if there is any mental or physical violence at home. Sometimes, a specialized non-stress test, contraction stress test, and biophysical profile are done to make sure the baby is not having a problem. Checking the amniotic fluid surrounding the baby is called an amniocentesis. The amniotic fluid is removed by sticking a needle into the belly (abdomen). This is sometimes done near the end of pregnancy if an early delivery is required. In this case, it is done to help make sure the baby's lungs are mature enough for the baby to live outside of the womb. If the lungs are not mature and it is unsafe to deliver the baby, an injection of cortisone medicine is given to the mother 1 to 2 days before the delivery. This helps the baby's lungs mature and makes it safer to deliver the baby. CHANGES OCCURING IN THE THIRD TRIMESTER OF PREGNANCY Your body goes through many changes during pregnancy. They vary from person to person. Talk to your caregiver about changes you notice and are concerned about.  During the last trimester, you have probably had an increase in your appetite. It is normal to have cravings for certain foods. This varies from person to person and pregnancy to pregnancy.  You may begin to get stretch marks on your hips, abdomen, and breasts. These are normal changes in the body   during pregnancy. There are no exercises or medicines to take which prevent this change.  Constipation may be treated with a stool softener or adding bulk to your diet. Drinking lots of fluids, fiber in vegetables, fruits, and whole grains are helpful.  Exercising is also helpful. If you have been very active up until your pregnancy, most of these activities can be continued during your pregnancy. If you have been less active, it is helpful to start an exercise  program such as walking. Consult your caregiver before starting exercise programs.  Avoid all smoking, alcohol, non-prescribed drugs, herbs and "street drugs" during your pregnancy. These chemicals affect the formation and growth of the baby. Avoid chemicals throughout the pregnancy to ensure the delivery of a healthy infant.  Backache, varicose veins, and hemorrhoids may develop or get worse.  You will tire more easily in the third trimester, which is normal.  The baby's movements may be stronger and more often.  You may become short of breath easily.  Your belly button may stick out.  A yellow discharge may leak from your breasts called colostrum.  You may have a bloody mucus discharge. This usually occurs a few days to a week before labor begins. HOME CARE INSTRUCTIONS   Keep your caregiver's appointments. Follow your caregiver's instructions regarding medicine use, exercise, and diet.  During pregnancy, you are providing food for you and your baby. Continue to eat regular, well-balanced meals. Choose foods such as meat, fish, milk and other low fat dairy products, vegetables, fruits, and whole-grain breads and cereals. Your caregiver will tell you of the ideal weight gain.  A physical sexual relationship may be continued throughout pregnancy if there are no other problems such as early (premature) leaking of amniotic fluid from the membranes, vaginal bleeding, or belly (abdominal) pain.  Exercise regularly if there are no restrictions. Check with your caregiver if you are unsure of the safety of your exercises. Greater weight gain will occur in the last 2 trimesters of pregnancy. Exercising helps:  Control your weight.  Get you in shape for labor and delivery.  You lose weight after you deliver.  Rest a lot with legs elevated, or as needed for leg cramps or low back pain.  Wear a good support or jogging bra for breast tenderness during pregnancy. This may help if worn during  sleep. Pads or tissues may be used in the bra if you are leaking colostrum.  Do not use hot tubs, steam rooms, or saunas.  Wear your seat belt when driving. This protects you and your baby if you are in an accident.  Avoid raw meat, cat litter boxes and soil used by cats. These carry germs that can cause birth defects in the baby.  It is easier to leak urine during pregnancy. Tightening up and strengthening the pelvic muscles will help with this problem. You can practice stopping your urination while you are going to the bathroom. These are the same muscles you need to strengthen. It is also the muscles you would use if you were trying to stop from passing gas. You can practice tightening these muscles up 10 times a set and repeating this about 3 times per day. Once you know what muscles to tighten up, do not perform these exercises during urination. It is more likely to cause an infection by backing up the urine.  Ask for help if you have financial, counseling, or nutritional needs during pregnancy. Your caregiver will be able to offer counseling for these   needs as well as refer you for other special needs.  Make a list of emergency phone numbers and have them available.  Plan on getting help from family or friends when you go home from the hospital.  Make a trial run to the hospital.  Take prenatal classes with the father to understand, practice, and ask questions about the labor and delivery.  Prepare the baby's room or nursery.  Do not travel out of the city unless it is absolutely necessary and with the advice of your caregiver.  Wear only low or no heal shoes to have better balance and prevent falling. MEDICINES AND DRUG USE IN PREGNANCY  Take prenatal vitamins as directed. The vitamin should contain 1 milligram of folic acid. Keep all vitamins out of reach of children. Only a couple vitamins or tablets containing iron may be fatal to a baby or young child when ingested.  Avoid use  of all medicines, including herbs, over-the-counter medicines, not prescribed or suggested by your caregiver. Only take over-the-counter or prescription medicines for pain, discomfort, or fever as directed by your caregiver. Do not use aspirin, ibuprofen or naproxen unless approved by your caregiver.  Let your caregiver also know about herbs you may be using.  Alcohol is related to a number of birth defects. This includes fetal alcohol syndrome. All alcohol, in any form, should be avoided completely. Smoking will cause low birth rate and premature babies.  Illegal drugs are very harmful to the baby. They are absolutely forbidden. A baby born to an addicted mother will be addicted at birth. The baby will go through the same withdrawal an adult does. SEEK MEDICAL CARE IF: You have any concerns or worries during your pregnancy. It is better to call with your questions if you feel they cannot wait, rather than worry about them. SEEK IMMEDIATE MEDICAL CARE IF:   An unexplained oral temperature above 102 F (38.9 C) develops, or as your caregiver suggests.  You have leaking of fluid from the vagina. If leaking membranes are suspected, take your temperature and tell your caregiver of this when you call.  There is vaginal spotting, bleeding or passing clots. Tell your caregiver of the amount and how many pads are used.  You develop a bad smelling vaginal discharge with a change in the color from clear to white.  You develop vomiting that lasts more than 24 hours.  You develop chills or fever.  You develop shortness of breath.  You develop burning on urination.  You loose more than 2 pounds of weight or gain more than 2 pounds of weight or as suggested by your caregiver.  You notice sudden swelling of your face, hands, and feet or legs.  You develop belly (abdominal) pain. Round ligament discomfort is a common non-cancerous (benign) cause of abdominal pain in pregnancy. Your caregiver still  must evaluate you.  You develop a severe headache that does not go away.  You develop visual problems, blurred or double vision.  If you have not felt your baby move for more than 1 hour. If you think the baby is not moving as much as usual, eat something with sugar in it and lie down on your left side for an hour. The baby should move at least 4 to 5 times per hour. Call right away if your baby moves less than that.  You fall, are in a car accident, or any kind of trauma.  There is mental or physical violence at home. Document Released: 06/11/2001   Document Revised: 03/11/2012 Document Reviewed: 12/14/2008 [employer] Patient Information 2014 [employer].  Breastfeeding A change in hormones during your pregnancy causes growth of your breast tissue and an increase in number and size of milk ducts. The hormone prolactin allows proteins, sugars, and fats from your blood supply to make breast milk in your milk-producing glands. The hormone progesterone prevents breast milk from being released before the birth of your baby. After the birth of your baby, your progesterone level decreases allowing breast milk to be released. Thoughts of your baby, as well as his or her sucking or crying, can stimulate the release of milk from the milk-producing glands. Deciding to breastfeed (nurse) is one of the best choices you can make for you and your baby. The information that follows gives a brief review of the benefits, as well as other important skills to know about breastfeeding. BENEFITS OF BREASTFEEDING For your baby  The first milk (colostrum) helps your baby's digestive system function better.   There are antibodies in your milk that help your baby fight off infections.   Your baby has a lower incidence of asthma, allergies, and sudden infant death syndrome (SIDS).   The nutrients in breast milk are better for your baby than infant formulas.  Breast milk improves your baby's brain development.    Your baby will have less gas, colic, and constipation.  Your baby is less likely to develop other conditions, such as childhood obesity, asthma, or diabetes mellitus. For you  Breastfeeding helps develop a very special bond between you and your baby.   Breastfeeding is convenient, always available at the correct temperature, and costs nothing.   Breastfeeding helps to burn calories and helps you lose the weight gained during pregnancy.   Breastfeeding makes your uterus contract back down to normal size faster and slows bleeding following delivery.   Breastfeeding mothers have a lower risk of developing osteoporosis or breast or ovarian cancer later in life.  BREASTFEEDING FREQUENCY  A healthy, full-term baby may breastfeed as often as every hour or space his or her feedings to every 3 hours. Breastfeeding frequency will vary from baby to baby.   Newborns should be fed no less than every 2 3 hours during the day and every 4 5 hours during the night. You should breastfeed a minimum of 8 feedings in a 24 hour period.  Awaken your baby to breastfeed if it has been 3 4 hours since the last feeding.  Breastfeed when you feel the need to reduce the fullness of your breasts or when your newborn shows signs of hunger. Signs that your baby may be hungry include:  Increased alertness or activity.  Stretching.  Movement of the head from side to side.  Movement of the head and opening of the mouth when the corner of the mouth or cheek is stroked (rooting).  Increased sucking sounds, smacking lips, cooing, sighing, or squeaking.  Hand-to-mouth movements.  Increased sucking of fingers or hands.  Fussing.  Intermittent crying.  Signs of extreme hunger will require calming and consoling before you try to feed your baby. Signs of extreme hunger may include:  Restlessness.  A loud, strong cry.  Screaming.  Frequent feeding will help you make more milk and will help prevent  problems, such as sore nipples and engorgement of the breasts.  BREASTFEEDING   Whether lying down or sitting, be sure that the baby's abdomen is facing your abdomen.   Support your breast with 4 fingers under your breast   and your thumb above your nipple. Make sure your fingers are well away from your nipple and your baby's mouth.   Stroke your baby's lips gently with your finger or nipple.   When your baby's mouth is open wide enough, place all of your nipple and as much of the colored area around your nipple (areola) as possible into your baby's mouth.  More areola should be visible above his or her upper lip than below his or her lower lip.  Your baby's tongue should be between his or her lower gum and your breast.  Ensure that your baby's mouth is correctly positioned around the nipple (latched). Your baby's lips should create a seal on your breast.  Signs that your baby has effectively latched onto your nipple include:  Tugging or sucking without pain.  Swallowing heard between sucks.  Absent click or smacking sound.  Muscle movement above and in front of his or her ears with sucking.  Your baby must suck about 2 3 minutes in order to get your milk. Allow your baby to feed on each breast as long as he or she wants. Nurse your baby until he or she unlatches or falls asleep at the first breast, then offer the second breast.  Signs that your baby is full and satisfied include:  A gradual decrease in the number of sucks or complete cessation of sucking.  Falling asleep.  Extension or relaxation of his or her body.  Retention of a small amount of milk in his or her mouth.  Letting go of your breast by himself or herself.  Signs of effective breastfeeding in you include:  Breasts that have increased firmness, weight, and size prior to feeding.  Breasts that are softer after nursing.  Increased milk volume, as well as a change in milk consistency and color by the 5th  day of breastfeeding.  Breast fullness relieved by breastfeeding.  Nipples are not sore, cracked, or bleeding.  If needed, break the suction by putting your finger into the corner of your baby's mouth and sliding your finger between his or her gums. Then, remove your breast from his or her mouth.  It is common for babies to spit up a small amount after a feeding.  Babies often swallow air during feeding. This can make babies fussy. Burping your baby between breasts can help with this.  Vitamin D supplements are recommended for babies who get only breast milk.  Avoid using a pacifier during your baby's first 4 6 weeks.  Avoid supplemental feedings of water, formula, or juice in place of breastfeeding. Breast milk is all the food your baby needs. It is not necessary for your baby to have water or formula. Your breasts will make more milk if supplemental feedings are avoided during the early weeks. HOW TO TELL WHETHER YOUR BABY IS GETTING ENOUGH BREAST MILK Wondering whether or not your baby is getting enough milk is a common concern among mothers. You can be assured that your baby is getting enough milk if:   Your baby is actively sucking and you hear swallowing.   Your baby seems relaxed and satisfied after a feeding.   Your baby nurses at least 8 12 times in a 24 hour time period.  During the first 3 5 days of age:  Your baby is wetting at least 3 5 diapers in a 24 hour period. The urine should be clear and pale yellow.  Your baby is having at least 3 4 stools in   a 24 hour period. The stool should be soft and yellow.  At 5 7 days of age, your baby is having at least 3 6 stools in a 24 hour period. The stool should be seedy and yellow by 5 days of age.  Your baby has a weight loss less than 7 10% during the first 3 days of age.  Your baby does not lose weight after 3 7 days of age.  Your baby gains 4 7 ounces each week after he or she is 4 days of age.  Your baby gains weight  by 5 days of age and is back to birth weight within 2 weeks. ENGORGEMENT In the first week after your baby is born, you may experience extremely full breasts (engorgement). When engorged, your breasts may feel heavy, warm, or tender to the touch. Engorgement peaks within 24 48 hours after delivery of your baby.  Engorgement may be reduced by:  Continuing to breastfeed.  Increasing the frequency of breastfeeding.  Taking warm showers or applying warm, moist heat to your breasts just before each feeding. This increases circulation and helps the milk flow.   Gently massaging your breast before and during the feedings. With your fingertips, massage from your chest wall towards your nipple in a circular motion.   Ensuring that your baby empties at least one breast at every feeding. It also helps to start the next feeding on the opposite breast.   Expressing breast milk by hand or by using a breast pump to empty the breasts if your baby is sleepy, or not nursing well. You may also want to express milk if you are returning to work oryou feel you are getting engorged.  Ensuring your baby is latched on and positioned properly while breastfeeding. If you follow these suggestions, your engorgement should improve in 24 48 hours. If you are still experiencing difficulty, call your lactation consultant or caregiver.  CARING FOR YOURSELF Take care of your breasts.  Bathe or shower daily.   Avoid using soap on your nipples.   Wear a supportive bra. Avoid wearing underwire style bras.  Air dry your nipples for a 3 4minutes after each feeding.   Use only cotton bra pads to absorb breast milk leakage. Leaking of breast milk between feedings is normal.   Use only pure lanolin on your nipples after nursing. You do not need to wash it off before feeding your baby again. Another option is to express a few drops of breast milk and gently massage that milk into your nipples.  Continue breast  self-awareness checks. Take care of yourself.  Eat healthy foods. Alternate 3 meals with 3 snacks.  Avoid foods that you notice affect your baby in a bad way.  Drink milk, fruit juice, and water to satisfy your thirst (about 8 glasses a day).   Rest often, relax, and take your prenatal vitamins to prevent fatigue, stress, and anemia.  Avoid chewing and smoking tobacco.  Avoid alcohol and drug use.  Take over-the-counter and prescribed medicine only as directed by your caregiver or pharmacist. You should always check with your caregiver or pharmacist before taking any new medicine, vitamin, or herbal supplement.  Know that pregnancy is possible while breastfeeding. If desired, talk to your caregiver about family planning and safe birth control methods that may be used while breastfeeding. SEEK MEDICAL CARE IF:   You feel like you want to stop breastfeeding or have become frustrated with breastfeeding.  You have painful breasts or nipples.    Your nipples are cracked or bleeding.  Your breasts are red, tender, or warm.  You have a swollen area on either breast.  You have a fever or chills.  You have nausea or vomiting.  You have drainage from your nipples.  Your breasts do not become full before feedings by the 5th day after delivery.  You feel sad and depressed.  Your baby is too sleepy to eat well.  Your baby is having trouble sleeping.   Your baby is wetting less than 3 diapers in a 24 hour period.  Your baby has less than 3 stools in a 24 hour period.  Your baby's skin or the white part of his or her eyes becomes more yellow.   Your baby is not gaining weight by 5 days of age. MAKE SURE YOU:   Understand these instructions.  Will watch your condition.  Will get help right away if you are not doing well or get worse. Document Released: 06/17/2005 Document Revised: 03/11/2012 Document Reviewed: 01/22/2012 [employer] Patient Information 2014 [employer].  

## 2013-04-15 NOTE — Progress Notes (Signed)
U/S scheduled with MFM on 04/22/13 at 315pm.

## 2013-04-15 NOTE — Progress Notes (Signed)
Pt. Dated by U/S at 27 wks.  Got pregnant on Depo--Unsure LMP. Schedule C-S + BTL for 39 wks. F/u U/s for growth with decreasing FH and dating discrepancy

## 2013-04-22 ENCOUNTER — Ambulatory Visit (HOSPITAL_COMMUNITY): Payer: Medicaid Other

## 2013-04-22 ENCOUNTER — Inpatient Hospital Stay (HOSPITAL_COMMUNITY)
Admission: AD | Admit: 2013-04-22 | Discharge: 2013-04-30 | DRG: 765 | Disposition: A | Discharge status: Home / Self Care | Payer: Medicaid Other | Source: Ambulatory Visit | Attending: Obstetrics & Gynecology | Admitting: Obstetrics & Gynecology

## 2013-04-22 ENCOUNTER — Encounter (HOSPITAL_COMMUNITY): Payer: Self-pay

## 2013-04-22 ENCOUNTER — Inpatient Hospital Stay (HOSPITAL_COMMUNITY): Payer: Medicaid Other

## 2013-04-22 DIAGNOSIS — O0930 Supervision of pregnancy with insufficient antenatal care, unspecified trimester: Secondary | ICD-10-CM

## 2013-04-22 DIAGNOSIS — O0933 Supervision of pregnancy with insufficient antenatal care, third trimester: Secondary | ICD-10-CM

## 2013-04-22 DIAGNOSIS — O4290 Premature rupture of membranes, unspecified as to length of time between rupture and onset of labor, unspecified weeks of gestation: Principal | ICD-10-CM | POA: Diagnosis present

## 2013-04-22 DIAGNOSIS — O321XX0 Maternal care for breech presentation, not applicable or unspecified: Secondary | ICD-10-CM | POA: Diagnosis present

## 2013-04-22 DIAGNOSIS — Z302 Encounter for sterilization: Secondary | ICD-10-CM

## 2013-04-22 DIAGNOSIS — O42913 Preterm premature rupture of membranes, unspecified as to length of time between rupture and onset of labor, third trimester: Secondary | ICD-10-CM

## 2013-04-22 DIAGNOSIS — O34219 Maternal care for unspecified type scar from previous cesarean delivery: Secondary | ICD-10-CM | POA: Diagnosis present

## 2013-04-22 LAB — URINALYSIS, ROUTINE W REFLEX MICROSCOPIC
Bilirubin Urine: NEGATIVE
Glucose, UA: NEGATIVE mg/dL
Hgb urine dipstick: NEGATIVE
Nitrite: NEGATIVE
Specific Gravity, Urine: 1.01 (ref 1.005–1.030)
pH: 6 (ref 5.0–8.0)

## 2013-04-22 LAB — URINE MICROSCOPIC-ADD ON

## 2013-04-22 LAB — CBC
MCH: 29.4 pg (ref 26.0–34.0)
Platelets: 164 10*3/uL (ref 150–400)
RBC: 3.37 MIL/uL — ABNORMAL LOW (ref 3.87–5.11)
RDW: 14.1 % (ref 11.5–15.5)
WBC: 7 10*3/uL (ref 4.0–10.5)

## 2013-04-22 LAB — POCT FERN TEST: POCT Fern Test: POSITIVE

## 2013-04-22 LAB — TYPE AND SCREEN: ABO/RH(D): O POS

## 2013-04-22 MED ORDER — SODIUM CHLORIDE 0.9 % IV SOLN
250.0000 mg | Freq: Four times a day (QID) | INTRAVENOUS | Status: AC
  Administered 2013-04-22 – 2013-04-23 (×7): 250 mg via INTRAVENOUS
  Filled 2013-04-22 (×8): qty 5

## 2013-04-22 MED ORDER — MAGNESIUM SULFATE BOLUS VIA INFUSION
6.0000 g | Freq: Once | INTRAVENOUS | Status: AC
  Administered 2013-04-22: 6 g via INTRAVENOUS
  Filled 2013-04-22: qty 500

## 2013-04-22 MED ORDER — AZITHROMYCIN 250 MG PO TABS: 500.0000 mg | ORAL_TABLET | Freq: Every day | ORAL | Status: DC

## 2013-04-22 MED ORDER — AMOXICILLIN 500 MG PO CAPS
500.0000 mg | ORAL_CAPSULE | Freq: Three times a day (TID) | ORAL | Status: DC
  Administered 2013-04-24 – 2013-04-27 (×11): 500 mg via ORAL
  Filled 2013-04-22 (×14): qty 1

## 2013-04-22 MED ORDER — SODIUM CHLORIDE 0.9 % IV SOLN
2.0000 g | Freq: Four times a day (QID) | INTRAVENOUS | Status: AC
  Administered 2013-04-22 – 2013-04-23 (×8): 2 g via INTRAVENOUS
  Filled 2013-04-22 (×8): qty 2000

## 2013-04-22 MED ORDER — DOCUSATE SODIUM 100 MG PO CAPS
100.0000 mg | ORAL_CAPSULE | Freq: Every day | ORAL | Status: DC
  Administered 2013-04-22 – 2013-04-27 (×6): 100 mg via ORAL
  Filled 2013-04-22 (×7): qty 1

## 2013-04-22 MED ORDER — MAGNESIUM SULFATE 40 G IN LACTATED RINGERS - SIMPLE
2.0000 g/h | INTRAVENOUS | Status: DC
  Administered 2013-04-23: 2 g/h via INTRAVENOUS
  Filled 2013-04-22 (×2): qty 500

## 2013-04-22 MED ORDER — ERYTHROMYCIN BASE 250 MG PO TABS
250.0000 mg | ORAL_TABLET | Freq: Four times a day (QID) | ORAL | Status: DC
  Administered 2013-04-24 – 2013-04-27 (×14): 250 mg via ORAL
  Filled 2013-04-22 (×19): qty 1

## 2013-04-22 MED ORDER — LACTATED RINGERS IV SOLN
INTRAVENOUS | Status: DC
  Administered 2013-04-22 – 2013-04-23 (×4): via INTRAVENOUS

## 2013-04-22 MED ORDER — BETAMETHASONE SOD PHOS & ACET 6 (3-3) MG/ML IJ SUSP
12.5000 mg | INTRAMUSCULAR | Status: AC
  Administered 2013-04-22 – 2013-04-23 (×2): 12.5 mg via INTRAMUSCULAR
  Filled 2013-04-22 (×2): qty 2.1

## 2013-04-22 MED ORDER — PRENATAL MULTIVITAMIN CH
1.0000 | ORAL_TABLET | Freq: Every day | ORAL | Status: DC
  Administered 2013-04-22 – 2013-04-27 (×6): 1 via ORAL
  Filled 2013-04-22 (×7): qty 1

## 2013-04-22 MED ORDER — CALCIUM CARBONATE ANTACID 500 MG PO CHEW
2.0000 | CHEWABLE_TABLET | ORAL | Status: DC | PRN
  Filled 2013-04-22: qty 2

## 2013-04-22 MED ORDER — ZOLPIDEM TARTRATE 5 MG PO TABS: 5.0000 mg | ORAL_TABLET | Freq: Every evening | ORAL | Status: DC | PRN

## 2013-04-22 MED ORDER — DEXTROSE 5 % IV SOLN: 500.0000 mg | INTRAVENOUS | Status: DC

## 2013-04-22 NOTE — Progress Notes (Signed)
Patient ID: Tami Thomas, female   DOB: 1985-09-28, 28 y.o.   MRN: [id number] Karyss Frese [redacted]w[redacted]d Estimated Date of Delivery: 06/14/13 with PPROM Getting Betamethasone Receiving antibiotics Continue conservative management until labor or until 34 weeks.  Jatoria Kneeland H

## 2013-04-22 NOTE — MAU Note (Signed)
Pt G3 P2 at 32.3wks, leaking clear fluid since 0100, denies cramping or contractions. Previous C/S x 2.

## 2013-04-22 NOTE — Plan of Care (Signed)
Problem: Consults Goal: Birthing Suites Patient Information Press F2 to bring up selections list   Pt < [redacted] weeks EGA     

## 2013-04-22 NOTE — H&P (Signed)
Tami Thomas is a 27 y.o. female G3P1102 at 73w3 (dated by [place] at 27wks) presenting for ROM at 1am this morning. Per pt felt a gush of clear fluid while lying in bed. After that time fluid continued to trickle down legs. No vaginal bleeding or discharge, no contraction pain. Still with good fetal movement. Of note pt is on prometrium for hx of preterm labor. Denies dysuria, pruritis, malodor. No recent illness  PNC at [facility] since 27wks. Got pregnant on depo. Had normal anatomy. Nml glucose screen. Plans for BLT (papers signed 04/01/13). Hx of 2 C/S, plan for RLTCS at 39weeks.  Maternal Medical History:  Reason for admission: Nausea.    OB History   Grav Para Term Preterm Abortions TAB SAB Ect Mult Living   3 2 1 1  0 0 0 0 0 2     1. Preterm, 32w0, 3lbs 10oz, m, ltcs 2. Term, 38w1, 6lbs 8oz, m, ltcs 3. current  Past Medical History  Diagnosis Date  . Anemia   . UTI (lower urinary tract infection)     with last pregnancy  . Chlamydia     2009  . History of preterm delivery, currently pregnant    Past Surgical History  Procedure Laterality Date  . Cesarean section  09/18/2007  . Cesarean section  09/11/2011    Procedure: CESAREAN SECTION;  Surgeon: Tami Bores, MD;  Location: [facility];  Service: Gynecology;  Laterality: N/A;  Repeat Cesarean Section Delivery Baby  Boy @ [phone], Apgars 7/9   Family History: family history includes Hypertension in her maternal grandmother. Social History:  reports that she has never smoked. She has never used smokeless tobacco. She reports that she does not drink alcohol or use illicit drugs.   Prenatal Transfer Tool  Maternal Diabetes: No Genetic Screening: Declined Maternal Ultrasounds/Referrals: Normal Fetal Ultrasounds or other Referrals:  None Maternal Substance Abuse:  No Significant Maternal Medications:  Meds include: Other: prometrium Significant Maternal Lab Results:  None, post CT (treated) Other Comments:  None  Review of Systems   Constitutional: Negative for fever.  HENT: Negative for congestion and sore throat.   Eyes: Negative for blurred vision and pain.  Respiratory: Negative for cough, shortness of breath and wheezing.   Cardiovascular: Negative for chest pain, palpitations and leg swelling.  Gastrointestinal: Negative for heartburn, nausea, vomiting, abdominal pain, diarrhea and constipation.  Genitourinary: Negative for dysuria, urgency, frequency and hematuria.  Musculoskeletal: Negative for falls and myalgias.  Skin: Negative for rash.  Neurological: Negative for dizziness, seizures, loss of consciousness, weakness and headaches.  Endo/Heme/Allergies: Does not bruise/bleed easily.  Psychiatric/Behavioral: Negative for depression.    Dilation: 2 Effacement (%): 50 Station: -3 Exam by:: Dr. Michail Thomas  Blood pressure 115/72, pulse 85, temperature 97.8 F (36.6 C), temperature source Oral, resp. rate 18, height 5\' 4"  (1.626 m), weight 54.159 kg (119 lb 6.4 oz), last menstrual period 08/29/2012. Maternal Exam:  Introitus: Vaginal discharge: clear fluid pooled in vaginal vault.    Physical Exam  Constitutional: She is oriented to person, place, and time. She appears well-developed and well-nourished. No distress.  HENT:  Head: Normocephalic and atraumatic.  Eyes: EOM are normal.  Neck: Neck supple.  Cardiovascular: Normal rate.   Respiratory: Effort normal. No respiratory distress.  GI: Soft. Bowel sounds are normal. She exhibits distension (gravid). There is no tenderness.  Genitourinary: Vaginal discharge: clear fluid pooled in vaginal vault.  Musculoskeletal: Normal range of motion.  Neurological: She is alert and oriented to person,  place, and time. No cranial nerve deficit.  Skin: Skin is warm and dry. No erythema.  Psychiatric: She has a normal mood and affect. Her behavior is normal.    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   1. PPROM- pt with hx of premature labor, on prometrium since 27 weeks. Had late prenatal care, got pregnant on depo. Positive fern, positive amnisure. Will try to extend pregnancy until at least 34weeks for fetal lung maturity. No consistent contraction pattern -admit to antenatal for expectant management -BMZ X2 -prophylatic abx- erythromycin and amp -bed rest, avoid cervical manipulation -consider continuation of prometrium? -GBS culture sent -temp q4 -daily WBC  2. FHR- cat II with one late decel -continuous fetal monitoring  3. Postpartum -plans for BTL- papers signed 04/01/13 -unsure abt feeding preference -f/up postpartum in 4-6 weeks at [facility], Tami Thomas 04/22/2013, 2:33 AM  I have seen and examined this patient and I agree with the above. Started on Mag 6 gm load then 2gm/hr once admitted to antenatal as ctx pattern had become more regular. Cam Hai 7:22 AM 04/22/2013

## 2013-04-22 NOTE — Progress Notes (Signed)
UR completed 

## 2013-04-23 LAB — CULTURE, OB URINE
Colony Count: NO GROWTH
Culture: NO GROWTH
Special Requests: NORMAL

## 2013-04-23 MED ORDER — ACETAMINOPHEN 325 MG PO TABS
650.0000 mg | ORAL_TABLET | ORAL | Status: DC | PRN
  Administered 2013-04-23 – 2013-04-26 (×6): 650 mg via ORAL
  Filled 2013-04-23 (×6): qty 2

## 2013-04-23 NOTE — Progress Notes (Signed)
Patient ID: Tami Thomas, female   DOB: 23-Mar-1986, 27 y.o.   MRN: [id number] FACULTY PRACTICE ANTEPARTUM(COMPREHENSIVE) NOTE  Tami Thomas is a 27 y.o. [id number] at [redacted]w[redacted]d by best clinical estimate who is admitted for rupture of membranes.   Fetal presentation is breech. Length of Stay:  1  Days  Subjective: No contractions Patient reports the fetal movement as active. Patient reports uterine contraction  activity as none. Patient reports  vaginal bleeding as none. Patient describes fluid per vagina as Clear.  Vitals:  Blood pressure 111/53, pulse 96, temperature 97.9 F (36.6 C), temperature source Oral, resp. rate 18, height 5\' 4"  (1.626 m), weight 119 lb 6.4 oz (54.159 kg), last menstrual period 08/29/2012, SpO2 99.00%. Physical Examination:  General appearance - alert, well appearing, and in no distress Heart - normal rate and regular rhythm Abdomen - soft, nontender, nondistended Fundal Height:  size equals dates Cervical Exam: Not evaluated. Extremities: extremities normal, atraumatic, no cyanosis or edema and Homans sign is negative, no sign of DVT Membranes:ruptured  Fetal Monitoring:  Baseline: 140 bpm, Variability: Good {> 6 bpm) and Decelerations: Variable: mild  Labs:  Results for orders placed during the hospital encounter of 04/22/13 (from the past 24 hour(s))  CBC   Collection Time    04/22/13  8:33 PM      Result Value Range   WBC 7.0  4.0 - 10.5 K/uL   RBC 3.37 (*) 3.87 - 5.11 MIL/uL   Hemoglobin 9.9 (*) 12.0 - 15.0 g/dL   HCT 14.7 (*) 82.9 - 56.2 %   MCV 88.1  78.0 - 100.0 fL   MCH 29.4  26.0 - 34.0 pg   MCHC 33.3  30.0 - 36.0 g/dL   RDW 13.0  86.5 - 78.4 %   Platelets 164  150 - 400 K/uL  TYPE AND SCREEN   Collection Time    04/22/13  8:33 PM      Result Value Range   ABO/RH(D) O POS     Antibody Screen NEG     Sample Expiration 04/25/2013      Imaging Studies:      Currently EPIC will not allow sonographic studies to automatically populate into  notes.  In the meantime, copy and paste results into note or free text.  Medications:  Scheduled . ampicillin (OMNIPEN) IV  2 g Intravenous Q6H   Followed by  . [START ON 04/24/2013] amoxicillin  500 mg Oral Q8H  . docusate sodium  100 mg Oral Daily  . erythromycin  250 mg Intravenous Q6H   Followed by  . [START ON 04/24/2013] erythromycin  250 mg Oral Q6H  . prenatal multivitamin  1 tablet Oral Q1200   I have reviewed the patient's current medications.  ASSESSMENT: Patient Active Problem List   Diagnosis Date Noted  . Insufficient prenatal care in third trimester 03/16/2013  . Previous cesarean section complicating pregnancy 03/16/2013  . History of preterm delivery, currently pregnant 10/09/2012    PLAN: D/C magnesium, observe for s/sx PTL  Jamespaul Secrist 04/23/2013,3:37 PM

## 2013-04-24 LAB — CULTURE, BETA STREP (GROUP B ONLY)

## 2013-04-24 MED ORDER — LACTATED RINGERS IV BOLUS (SEPSIS)
1000.0000 mL | Freq: Once | INTRAVENOUS | Status: AC
  Administered 2013-04-24: 1000 mL via INTRAVENOUS

## 2013-04-24 MED ORDER — NIFEDIPINE 10 MG PO CAPS
10.0000 mg | ORAL_CAPSULE | ORAL | Status: AC | PRN
  Administered 2013-04-24 (×4): 10 mg via ORAL
  Filled 2013-04-24 (×4): qty 1

## 2013-04-24 MED ORDER — LACTATED RINGERS IV SOLN
INTRAVENOUS | Status: DC
  Administered 2013-04-24: 23:00:00 via INTRAVENOUS
  Administered 2013-04-24: 125 mL/h via INTRAVENOUS
  Administered 2013-04-25 – 2013-04-27 (×10): via INTRAVENOUS

## 2013-04-24 NOTE — Progress Notes (Signed)
Patient ID: Tami Thomas, female   DOB: 07/18/1985, 27 y.o.   MRN: [id number] RN notified CNM of increased UI over past 1 1/2 hours. Pt unaware. Fluid bolus given. No improvement. Procardia ordered 10 mg Q20 min PRN max 4 doses. Continue IV fluids. Will perform SSE PRN to assess dilation for no improvement. Breech per [place] 04/22/2013.  [place], CNM 04/24/2013 2:38 AM

## 2013-04-24 NOTE — Progress Notes (Signed)
Patient ID: Tami Thomas, female   DOB: [birth year], 27 y.o.   MRN: [id number] FACULTY PRACTICE ANTEPARTUM(COMPREHENSIVE) NOTE  Tami Thomas is a 27 y.o. G3P1102 at [redacted]w[redacted]d by early ultrasound who is admitted for rupture of membranes.   Fetal presentation is breech. Length of Stay:  2  Days  Subjective: Mild contractions irregular Patient reports the fetal movement as active. Patient reports uterine contraction  activity as mild irregular. Patient reports  vaginal bleeding as none. Patient describes fluid per vagina as Clear.  Vitals:  Blood pressure 112/61, pulse 95, temperature 98.6 F (37 C), temperature source Oral, resp. rate 16, height 5\' 4"  (1.626 m), weight 119 lb 6.4 oz (54.159 kg), last menstrual period 08/29/2012, SpO2 99.00%. Physical Examination:  General appearance - alert, well appearing, and in no distress Heart - normal rate and regular rhythm Abdomen - soft, nontender, nondistended Fundal Height:  size equals dates Cervical Exam: Not evaluated. Extremities: extremities normal, atraumatic, no cyanosis or edema  Membranes:ruptured  Fetal Monitoring:  Baseline: 145 bpm, Variability: Fair (1-6 bpm), Accelerations: Reactive and Decelerations: Absent  Labs:  No results found for this or any previous visit (from the past 24 hour(s)).  Imaging Studies:      Medications:  Scheduled . amoxicillin  500 mg Oral Q8H  . docusate sodium  100 mg Oral Daily  . erythromycin  250 mg Oral Q6H  . prenatal multivitamin  1 tablet Oral Q1200   I have reviewed the patient's current medications.  ASSESSMENT: Patient Active Problem List   Diagnosis Date Noted  . Insufficient prenatal care in third trimester 03/16/2013  . Previous cesarean section complicating pregnancy 03/16/2013  . History of preterm delivery, currently pregnant 10/09/2012  premature rupture of membranes  PLAN: Watch for S/SX PTL. Plans repeat cesarean section and BTL  ARNOLD,JAMES 04/24/2013,7:28  AM

## 2013-04-25 DIAGNOSIS — O3421 Maternal care for scar from previous cesarean delivery: Secondary | ICD-10-CM

## 2013-04-25 DIAGNOSIS — O4290 Premature rupture of membranes, unspecified as to length of time between rupture and onset of labor, unspecified weeks of gestation: Secondary | ICD-10-CM

## 2013-04-25 DIAGNOSIS — O42913 Preterm premature rupture of membranes, unspecified as to length of time between rupture and onset of labor, third trimester: Secondary | ICD-10-CM | POA: Diagnosis present

## 2013-04-25 NOTE — Progress Notes (Signed)
Dr Reola Calkins notified pt had 4 uc in one hour.

## 2013-04-25 NOTE — Progress Notes (Signed)
Patient ID: Tami Thomas, female   DOB: 1985-08-26, 27 y.o.   MRN: [id number] FACULTY PRACTICE ANTEPARTUM(COMPREHENSIVE) NOTE  Tami Thomas is a 27 y.o. G3P1102 at [redacted]w[redacted]d by early ultrasound who is admitted for preterm premature rupture of membranes.   Fetal presentation is breech. Length of Stay:  3  Days  Subjective: Rare contractions, denies abdominal pain Patient reports the fetal movement as active. Patient reports uterine contraction  activity as mild irregular. Patient reports vaginal bleeding as none. Patient describes fluid per vagina as Clear.  Vitals:  Blood pressure 108/59, pulse 77, temperature 98.2 F (36.8 C), temperature source Oral, resp. rate 18, height 5\' 4"  (1.626 m), weight 119 lb 6.4 oz (54.159 kg), last menstrual period 08/29/2012, SpO2 99.00%. Physical Examination: General appearance - alert, well appearing, and in no distress Heart - normal rate and regular rhythm Abdomen - soft, nontender, nondistended Fundal Height:  size equals dates Cervical Exam: Not evaluated. Extremities: extremities normal, atraumatic, no cyanosis or edema  Membranes:ruptured  Fetal Monitoring:  Baseline: 145 bpm, Variability: moderate, Accelerations: Reactive and Decelerations: Absent  Labs:  No results found for this or any previous visit (from the past 24 hour(s)).  Imaging Studies:    04/22/13  [redacted]w[redacted]d  EFW 1734g (3 lb 13 oz)/35 %tile, AFI 1.72 cm, breech presentation, posterior fundal placenta, normal interval anatomy  Medications:  Scheduled . amoxicillin  500 mg Oral Q8H  . docusate sodium  100 mg Oral Daily  . erythromycin  250 mg Oral Q6H  . prenatal multivitamin  1 tablet Oral Q1200   I have reviewed the patient's current medications.  ASSESSMENT: Patient Active Problem List   Diagnosis Date Noted  . Preterm premature rupture of membranes in third trimester 04/25/2013  . Insufficient prenatal care in third trimester 03/16/2013  . Previous cesarean section  complicating pregnancy 03/16/2013  . History of preterm delivery, currently pregnant 10/09/2012  premature rupture of membranes  PLAN: Watch for signs and symptoms of  PTL and chorioamnionitis. Plans repeat cesarean section and BTL Will schedule for UA dopplers, BPP this week Routine antenatal care  Tami Newcomer, MD  04/25/2013,6:56 AM

## 2013-04-25 NOTE — Progress Notes (Signed)
EFM removed.

## 2013-04-26 ENCOUNTER — Inpatient Hospital Stay (HOSPITAL_COMMUNITY): Payer: Medicaid Other

## 2013-04-26 LAB — TYPE AND SCREEN: Antibody Screen: NEGATIVE

## 2013-04-26 NOTE — Progress Notes (Signed)
FACULTY PRACTICE ANTEPARTUM(COMPREHENSIVE) NOTE  Tami Thomas is a 27 y.o. [id number] at 104w0d by midtrimester ultrasound, R=27 who is admitted for PROM.   Fetal presentation is breech. Length of Stay:  4  Days  Subjective: Pt states no issues this morning. No complaints. No pain. Patient reports the fetal movement as active. Patient reports uterine contraction  activity as irregular, every 30-45 minutes. Patient reports  vaginal bleeding as none. Patient describes fluid per vagina as Clear.  Vitals:  Blood pressure 110/74, pulse 81, temperature 97.5 F (36.4 C), temperature source Oral, resp. rate 18, height 5\' 4"  (1.626 m), weight 119 lb 6.4 oz (54.159 kg), last menstrual period 08/29/2012, SpO2 99.00%. Physical Examination:  General appearance - alert, well appearing, and in no distress Heart - normal rate and regular rhythm Abdomen - soft, nontender, nondistended Fundal Height:  size equals dates Cervical Exam: Not evaluated. Extremities: extremities normal, atraumatic, no cyanosis or edema and Homans sign is negative, no sign of DVT with DTRs 2+ bilaterally Membranes:intact, ruptured, clear fluid  Fetal Monitoring:  Baseline: 130s bpm, Variability: Good {> 6 bpm), Accelerations: Reactive and Decelerations: Absent  Labs:  Results for orders placed during the hospital encounter of 04/22/13 (from the past 24 hour(s))  TYPE AND SCREEN   Collection Time    04/26/13 12:45 AM      Result Value Range   ABO/RH(D) O POS     Antibody Screen NEG     Sample Expiration 04/29/2013      Imaging Studies:    27Oct - BPP 8/10 -2 for Breathing, oligohydramnios, breech  Medications:  Scheduled . amoxicillin  500 mg Oral Q8H  . docusate sodium  100 mg Oral Daily  . erythromycin  250 mg Oral Q6H  . prenatal multivitamin  1 tablet Oral Q1200   I have reviewed the patient's current medications.  ASSESSMENT: Tami Thomas is a 27 y.o. [id number] at [redacted]w[redacted]d by midtrimester ultrasound who is  admitted for rupture of membranes.    Patient Active Problem List   Diagnosis Date Noted  . Preterm premature rupture of membranes in third trimester 04/25/2013  . Insufficient prenatal care in third trimester 03/16/2013  . Previous cesarean section complicating pregnancy 03/16/2013  . History of preterm delivery, currently pregnant 10/09/2012    PLAN: Tami Thomas is a 27 y.o. [id number] at [redacted]w[redacted]d R=27 presents with ROM  PPROM:  continue prophylactic abx.   NST qshift.  Cont procardia PRN. Pt rec'd Steroids 10/23 and 10/24 rexamine with speculum if worsening contractions. Plan for repeat c-section for worsening fetal status or 34wks   Tami Thomas 04/26/2013,10:42 AM

## 2013-04-26 NOTE — Progress Notes (Signed)
Ur chart review completed.  

## 2013-04-27 ENCOUNTER — Encounter (HOSPITAL_COMMUNITY): Payer: Medicaid Other | Admitting: Anesthesiology

## 2013-04-27 ENCOUNTER — Encounter (HOSPITAL_COMMUNITY): Payer: Self-pay | Admitting: Anesthesiology

## 2013-04-27 ENCOUNTER — Inpatient Hospital Stay (HOSPITAL_COMMUNITY): Payer: Medicaid Other | Admitting: Anesthesiology

## 2013-04-27 ENCOUNTER — Encounter (HOSPITAL_COMMUNITY)
Admission: AD | Disposition: A | Discharge status: Home / Self Care | Payer: Self-pay | Source: Ambulatory Visit | Attending: Obstetrics & Gynecology

## 2013-04-27 LAB — CBC
HCT: 31.4 % — ABNORMAL LOW (ref 36.0–46.0)
Hemoglobin: 10.2 g/dL — ABNORMAL LOW (ref 12.0–15.0)
MCH: 28.8 pg (ref 26.0–34.0)
MCV: 88.7 fL (ref 78.0–100.0)
RBC: 3.54 MIL/uL — ABNORMAL LOW (ref 3.87–5.11)
RDW: 14.1 % (ref 11.5–15.5)

## 2013-04-27 SURGERY — Surgical Case
Anesthesia: Spinal | Site: Abdomen | Wound class: Clean Contaminated

## 2013-04-27 MED ORDER — SIMETHICONE 80 MG PO CHEW
80.0000 mg | CHEWABLE_TABLET | Freq: Three times a day (TID) | ORAL | Status: DC
  Administered 2013-04-27 – 2013-04-30 (×7): 80 mg via ORAL
  Filled 2013-04-27 (×8): qty 1

## 2013-04-27 MED ORDER — SODIUM CHLORIDE 0.9 % IJ SOLN: 3.0000 mL | INTRAMUSCULAR | Status: DC | PRN

## 2013-04-27 MED ORDER — DIPHENHYDRAMINE HCL 25 MG PO CAPS
25.0000 mg | ORAL_CAPSULE | ORAL | Status: DC | PRN
  Administered 2013-04-28: 25 mg via ORAL

## 2013-04-27 MED ORDER — MORPHINE SULFATE 0.5 MG/ML IJ SOLN
INTRAMUSCULAR | Status: AC
Start: 2013-04-27 — End: 2013-04-27
  Filled 2013-04-27: qty 10

## 2013-04-27 MED ORDER — OXYTOCIN 40 UNITS IN LACTATED RINGERS INFUSION - SIMPLE MED: 62.5000 mL/h | INTRAVENOUS | Status: AC

## 2013-04-27 MED ORDER — PROMETHAZINE HCL 25 MG/ML IJ SOLN: 6.2500 mg | INTRAMUSCULAR | Status: DC | PRN

## 2013-04-27 MED ORDER — WITCH HAZEL-GLYCERIN EX PADS: 1.0000 "application " | MEDICATED_PAD | CUTANEOUS | Status: DC | PRN

## 2013-04-27 MED ORDER — BUPIVACAINE HCL (PF) 0.5 % IJ SOLN
INTRAMUSCULAR | Status: AC
  Filled 2013-04-27: qty 30

## 2013-04-27 MED ORDER — LANOLIN HYDROUS EX OINT: 1.0000 "application " | TOPICAL_OINTMENT | CUTANEOUS | Status: DC | PRN

## 2013-04-27 MED ORDER — MEASLES, MUMPS & RUBELLA VAC ~~LOC~~ INJ: 0.5000 mL | INJECTION | Freq: Once | SUBCUTANEOUS | Status: DC

## 2013-04-27 MED ORDER — FENTANYL CITRATE 0.05 MG/ML IJ SOLN
INTRAMUSCULAR | Status: DC | PRN
  Administered 2013-04-27: 10 ug via INTRATHECAL

## 2013-04-27 MED ORDER — MEPERIDINE HCL 25 MG/ML IJ SOLN: 6.2500 mg | INTRAMUSCULAR | Status: DC | PRN

## 2013-04-27 MED ORDER — KETOROLAC TROMETHAMINE 30 MG/ML IJ SOLN: 30.0000 mg | Freq: Four times a day (QID) | INTRAMUSCULAR | Status: AC | PRN

## 2013-04-27 MED ORDER — OXYCODONE-ACETAMINOPHEN 5-325 MG PO TABS
1.0000 | ORAL_TABLET | ORAL | Status: DC | PRN
  Administered 2013-04-28: 1 via ORAL
  Administered 2013-04-29: 2 via ORAL
  Administered 2013-04-29 – 2013-04-30 (×2): 1 via ORAL
  Filled 2013-04-27 (×3): qty 1
  Filled 2013-04-27: qty 2

## 2013-04-27 MED ORDER — CEFAZOLIN SODIUM-DEXTROSE 2-3 GM-% IV SOLR
2.0000 g | INTRAVENOUS | Status: AC
  Administered 2013-04-27: 2 g via INTRAVENOUS

## 2013-04-27 MED ORDER — MEPERIDINE HCL 25 MG/ML IJ SOLN
INTRAMUSCULAR | Status: AC
  Filled 2013-04-27: qty 1

## 2013-04-27 MED ORDER — NALBUPHINE HCL 10 MG/ML IJ SOLN: 5.0000 mg | INTRAMUSCULAR | Status: DC | PRN

## 2013-04-27 MED ORDER — LACTATED RINGERS IV SOLN
40.0000 [IU] | INTRAVENOUS | Status: DC | PRN
  Administered 2013-04-27: 40 [IU] via INTRAVENOUS

## 2013-04-27 MED ORDER — LACTATED RINGERS IV SOLN
INTRAVENOUS | Status: DC
  Administered 2013-04-28: 03:00:00 via INTRAVENOUS

## 2013-04-27 MED ORDER — SIMETHICONE 80 MG PO CHEW: 80.0000 mg | CHEWABLE_TABLET | ORAL | Status: DC | PRN

## 2013-04-27 MED ORDER — CITRIC ACID-SODIUM CITRATE 334-500 MG/5ML PO SOLN
ORAL | Status: AC
  Filled 2013-04-27: qty 15

## 2013-04-27 MED ORDER — PHENYLEPHRINE 8 MG IN D5W 100 ML (0.08MG/ML) PREMIX OPTIME
INJECTION | INTRAVENOUS | Status: DC | PRN
  Administered 2013-04-27: 50 ug/min via INTRAVENOUS

## 2013-04-27 MED ORDER — IBUPROFEN 600 MG PO TABS
600.0000 mg | ORAL_TABLET | Freq: Four times a day (QID) | ORAL | Status: DC
  Administered 2013-04-27 – 2013-04-30 (×10): 600 mg via ORAL
  Filled 2013-04-27 (×10): qty 1

## 2013-04-27 MED ORDER — HYDROMORPHONE HCL PF 1 MG/ML IJ SOLN: 0.2500 mg | INTRAMUSCULAR | Status: DC | PRN

## 2013-04-27 MED ORDER — NALBUPHINE HCL 10 MG/ML IJ SOLN
5.0000 mg | INTRAMUSCULAR | Status: DC | PRN
  Administered 2013-04-27: 10 mg via SUBCUTANEOUS

## 2013-04-27 MED ORDER — BUPIVACAINE IN DEXTROSE 0.75-8.25 % IT SOLN
INTRATHECAL | Status: DC | PRN
  Administered 2013-04-27: 1.2 mL via INTRATHECAL

## 2013-04-27 MED ORDER — ONDANSETRON HCL 4 MG/2ML IJ SOLN: 4.0000 mg | INTRAMUSCULAR | Status: DC | PRN

## 2013-04-27 MED ORDER — OXYTOCIN 10 UNIT/ML IJ SOLN
INTRAMUSCULAR | Status: AC
  Filled 2013-04-27: qty 4

## 2013-04-27 MED ORDER — PHENYLEPHRINE HCL 10 MG/ML IJ SOLN
INTRAMUSCULAR | Status: AC
  Filled 2013-04-27: qty 1

## 2013-04-27 MED ORDER — TETANUS-DIPHTH-ACELL PERTUSSIS 5-2.5-18.5 LF-MCG/0.5 IM SUSP: 0.5000 mL | Freq: Once | INTRAMUSCULAR | Status: DC

## 2013-04-27 MED ORDER — CITRIC ACID-SODIUM CITRATE 334-500 MG/5ML PO SOLN
30.0000 mL | Freq: Once | ORAL | Status: AC
  Administered 2013-04-27: 30 mL via ORAL

## 2013-04-27 MED ORDER — ONDANSETRON HCL 4 MG/2ML IJ SOLN
INTRAMUSCULAR | Status: AC
  Filled 2013-04-27: qty 2

## 2013-04-27 MED ORDER — SCOPOLAMINE 1 MG/3DAYS TD PT72
1.0000 | MEDICATED_PATCH | Freq: Once | TRANSDERMAL | Status: DC
  Administered 2013-04-27: 1.5 mg via TRANSDERMAL

## 2013-04-27 MED ORDER — KETOROLAC TROMETHAMINE 30 MG/ML IJ SOLN: 15.0000 mg | Freq: Once | INTRAMUSCULAR | Status: DC | PRN

## 2013-04-27 MED ORDER — ONDANSETRON HCL 4 MG/2ML IJ SOLN
INTRAMUSCULAR | Status: DC | PRN
  Administered 2013-04-27: 4 mg via INTRAVENOUS

## 2013-04-27 MED ORDER — METOCLOPRAMIDE HCL 5 MG/ML IJ SOLN: 10.0000 mg | Freq: Three times a day (TID) | INTRAMUSCULAR | Status: DC | PRN

## 2013-04-27 MED ORDER — DIBUCAINE 1 % RE OINT: 1.0000 "application " | TOPICAL_OINTMENT | RECTAL | Status: DC | PRN

## 2013-04-27 MED ORDER — KETOROLAC TROMETHAMINE 60 MG/2ML IM SOLN
INTRAMUSCULAR | Status: AC
  Administered 2013-04-27: 60 mg via INTRAMUSCULAR
  Filled 2013-04-27: qty 2

## 2013-04-27 MED ORDER — MORPHINE SULFATE 0.5 MG/ML IJ SOLN
INTRAMUSCULAR | Status: AC
  Filled 2013-04-27: qty 10

## 2013-04-27 MED ORDER — ZOLPIDEM TARTRATE 5 MG PO TABS: 5.0000 mg | ORAL_TABLET | Freq: Every evening | ORAL | Status: DC | PRN

## 2013-04-27 MED ORDER — ONDANSETRON HCL 4 MG/2ML IJ SOLN: 4.0000 mg | Freq: Three times a day (TID) | INTRAMUSCULAR | Status: DC | PRN

## 2013-04-27 MED ORDER — NALBUPHINE SYRINGE 5 MG/0.5 ML
INJECTION | INTRAMUSCULAR | Status: AC
  Filled 2013-04-27: qty 1

## 2013-04-27 MED ORDER — FENTANYL CITRATE 0.05 MG/ML IJ SOLN
INTRAMUSCULAR | Status: DC | PRN
  Administered 2013-04-27: 90 ug via INTRAVENOUS

## 2013-04-27 MED ORDER — SIMETHICONE 80 MG PO CHEW
80.0000 mg | CHEWABLE_TABLET | ORAL | Status: DC
  Administered 2013-04-28 – 2013-04-29 (×2): 80 mg via ORAL
  Filled 2013-04-27: qty 1

## 2013-04-27 MED ORDER — KETOROLAC TROMETHAMINE 60 MG/2ML IM SOLN
60.0000 mg | Freq: Once | INTRAMUSCULAR | Status: AC | PRN
  Administered 2013-04-27: 60 mg via INTRAMUSCULAR

## 2013-04-27 MED ORDER — BUPIVACAINE HCL (PF) 0.5 % IJ SOLN
INTRAMUSCULAR | Status: DC | PRN
  Administered 2013-04-27: 30 mL

## 2013-04-27 MED ORDER — SCOPOLAMINE 1 MG/3DAYS TD PT72
MEDICATED_PATCH | TRANSDERMAL | Status: AC
  Filled 2013-04-27: qty 1

## 2013-04-27 MED ORDER — MORPHINE SULFATE (PF) 0.5 MG/ML IJ SOLN
INTRAMUSCULAR | Status: DC | PRN
  Administered 2013-04-27: 2.8 mg via INTRAVENOUS
  Administered 2013-04-27: 2 mg via INTRAVENOUS

## 2013-04-27 MED ORDER — DIPHENHYDRAMINE HCL 50 MG/ML IJ SOLN: 25.0000 mg | INTRAMUSCULAR | Status: DC | PRN

## 2013-04-27 MED ORDER — CEFAZOLIN SODIUM-DEXTROSE 2-3 GM-% IV SOLR
INTRAVENOUS | Status: AC
Start: 2013-04-27 — End: 2013-04-27
  Filled 2013-04-27: qty 50

## 2013-04-27 MED ORDER — BUPIVACAINE HCL (PF) 0.25 % IJ SOLN
INTRAMUSCULAR | Status: AC
  Filled 2013-04-27: qty 20

## 2013-04-27 MED ORDER — ONDANSETRON HCL 4 MG PO TABS: 4.0000 mg | ORAL_TABLET | ORAL | Status: DC | PRN

## 2013-04-27 MED ORDER — MORPHINE SULFATE (PF) 0.5 MG/ML IJ SOLN
INTRAMUSCULAR | Status: DC | PRN
  Administered 2013-04-27: .2 mg via INTRATHECAL

## 2013-04-27 MED ORDER — NALOXONE HCL 1 MG/ML IJ SOLN: 1.0000 ug/kg/h | INTRAVENOUS | Status: DC | PRN

## 2013-04-27 MED ORDER — PRENATAL MULTIVITAMIN CH
1.0000 | ORAL_TABLET | Freq: Every day | ORAL | Status: DC
  Administered 2013-04-28 – 2013-04-29 (×2): 1 via ORAL
  Filled 2013-04-27 (×2): qty 1

## 2013-04-27 MED ORDER — GLYCOPYRROLATE 0.2 MG/ML IJ SOLN
INTRAMUSCULAR | Status: DC | PRN
  Administered 2013-04-27: 0.2 mg via INTRAVENOUS

## 2013-04-27 MED ORDER — MEPERIDINE HCL 25 MG/ML IJ SOLN
INTRAMUSCULAR | Status: DC | PRN
  Administered 2013-04-27 (×2): 12.5 mg via INTRAVENOUS

## 2013-04-27 MED ORDER — DIPHENHYDRAMINE HCL 25 MG PO CAPS
25.0000 mg | ORAL_CAPSULE | Freq: Four times a day (QID) | ORAL | Status: DC | PRN
  Filled 2013-04-27: qty 1

## 2013-04-27 MED ORDER — MAGNESIUM HYDROXIDE 400 MG/5ML PO SUSP
30.0000 mL | ORAL | Status: DC | PRN
  Administered 2013-04-30: 30 mL via ORAL
  Filled 2013-04-27: qty 30

## 2013-04-27 MED ORDER — MENTHOL 3 MG MT LOZG: 1.0000 | LOZENGE | OROMUCOSAL | Status: DC | PRN

## 2013-04-27 MED ORDER — DIPHENHYDRAMINE HCL 50 MG/ML IJ SOLN: 12.5000 mg | INTRAMUSCULAR | Status: DC | PRN

## 2013-04-27 MED ORDER — SENNOSIDES-DOCUSATE SODIUM 8.6-50 MG PO TABS
2.0000 | ORAL_TABLET | ORAL | Status: DC
  Administered 2013-04-27 – 2013-04-29 (×3): 2 via ORAL
  Filled 2013-04-27 (×3): qty 2

## 2013-04-27 MED ORDER — NALOXONE HCL 0.4 MG/ML IJ SOLN: 0.4000 mg | INTRAMUSCULAR | Status: DC | PRN

## 2013-04-27 MED ORDER — LACTATED RINGERS IV SOLN: INTRAVENOUS | Status: DC

## 2013-04-27 MED ORDER — FENTANYL CITRATE 0.05 MG/ML IJ SOLN
INTRAMUSCULAR | Status: AC
  Filled 2013-04-27: qty 2

## 2013-04-27 MED ORDER — GLYCOPYRROLATE 0.2 MG/ML IJ SOLN
INTRAMUSCULAR | Status: AC
  Filled 2013-04-27: qty 1

## 2013-04-27 SURGICAL SUPPLY — 32 items
BARRIER ADHS 3X4 INTERCEED (GAUZE/BANDAGES/DRESSINGS) IMPLANT
BENZOIN TINCTURE PRP APPL 2/3 (GAUZE/BANDAGES/DRESSINGS) ×3 IMPLANT
CLAMP CORD UMBIL (MISCELLANEOUS) IMPLANT
CLIP FILSHIE TUBAL LIGA STRL (Clip) ×3 IMPLANT
CLOTH BEACON ORANGE TIMEOUT ST (SAFETY) ×3 IMPLANT
DRAPE LG THREE QUARTER DISP (DRAPES) ×6 IMPLANT
DRSG OPSITE POSTOP 4X10 (GAUZE/BANDAGES/DRESSINGS) ×3 IMPLANT
DURAPREP 26ML APPLICATOR (WOUND CARE) ×3 IMPLANT
ELECT REM PT RETURN 9FT ADLT (ELECTROSURGICAL) ×3
ELECTRODE REM PT RTRN 9FT ADLT (ELECTROSURGICAL) ×2 IMPLANT
EXTRACTOR VACUUM KIWI (MISCELLANEOUS) IMPLANT
GLOVE BIO SURGEON STRL SZ 6.5 (GLOVE) ×3 IMPLANT
GLOVE BIOGEL PI IND STRL 7.0 (GLOVE) ×2 IMPLANT
GLOVE BIOGEL PI INDICATOR 7.0 (GLOVE) ×1
GOWN PREVENTION PLUS XLARGE (GOWN DISPOSABLE) ×6 IMPLANT
GOWN STRL REIN XL XLG (GOWN DISPOSABLE) ×6 IMPLANT
KIT ABG SYR 3ML LUER SLIP (SYRINGE) ×3 IMPLANT
NEEDLE HYPO 25X5/8 SAFETYGLIDE (NEEDLE) ×3 IMPLANT
NS IRRIG 1000ML POUR BTL (IV SOLUTION) ×3 IMPLANT
PACK C SECTION WH (CUSTOM PROCEDURE TRAY) ×3 IMPLANT
PAD ABD 7.5X8 STRL (GAUZE/BANDAGES/DRESSINGS) ×6 IMPLANT
PAD OB MATERNITY 4.3X12.25 (PERSONAL CARE ITEMS) ×3 IMPLANT
RETAINER VISCERAL (MISCELLANEOUS) ×3 IMPLANT
STRIP CLOSURE SKIN 1/2X4 (GAUZE/BANDAGES/DRESSINGS) ×3 IMPLANT
SUT VIC AB 0 CT1 36 (SUTURE) ×12 IMPLANT
SUT VIC AB 2-0 CT1 27 (SUTURE) ×1
SUT VIC AB 2-0 CT1 TAPERPNT 27 (SUTURE) ×2 IMPLANT
SUT VIC AB 4-0 PS2 27 (SUTURE) ×3 IMPLANT
TAPE PAPER MEDFIX 1IN X 10YD (GAUZE/BANDAGES/DRESSINGS) ×3 IMPLANT
TOWEL OR 17X24 6PK STRL BLUE (TOWEL DISPOSABLE) ×3 IMPLANT
TRAY FOLEY CATH 14FR (SET/KITS/TRAYS/PACK) ×3 IMPLANT
WATER STERILE IRR 1000ML POUR (IV SOLUTION) ×3 IMPLANT

## 2013-04-27 NOTE — Progress Notes (Signed)
Kaliopi Blyden is a 27 y.o. G3P1102 at [redacted]w[redacted]d by ultrasound admitted for Preterm labor, PROM  Subjective:contractions increasing   Objective: BP 101/45  Pulse 78  Temp(Src) 98.3 F (36.8 C) (Oral)  Resp 18  Ht 5\' 4"  (1.626 m)  Wt 119 lb 6.4 oz (54.159 kg)  BMI 20.48 kg/m2  SpO2 99%  LMP 08/29/2012      FHT:  FHR: 145 bpm, variability: moderate,  accelerations:  Present,  decelerations:  Present variables UC:   regular, every 4 minutes SVE:   80/7/br/-2  Labs: Lab Results  Component Value Date   WBC 7.0 04/22/2013   HGB 9.9* 04/22/2013   HCT 29.7* 04/22/2013   MCV 88.1 04/22/2013   PLT 164 04/22/2013    Assessment / Plan: Spontaneous labor, progressing normally, preterm, breech, previous cesarean section and desires sterilization  Labor: PTL Preeclampsia:  no signs or symptoms of toxicity Fetal Wellbeing:  Category I Pain Control:    I/D:  on  PO ABX Anticipated MOD:   Cesarean section, BTL. The procedure and the risk of anesthesia, bleeding, infection, bowel and bladder injury, failure (1/200) and ectopic pregnancy were discussed and her questions were answered. Posted in OR urgent.     ARNOLD,JAMES 04/27/2013, 4:11 PM

## 2013-04-27 NOTE — Anesthesia Procedure Notes (Signed)
Spinal  Patient location during procedure: OR Start time: 04/27/2013 4:33 PM End time: 04/27/2013 4:36 PM Staffing Anesthesiologist: Leilani Able Performed by: anesthesiologist  Preanesthetic Checklist Completed: patient identified, surgical consent, pre-op evaluation, timeout performed, IV checked, risks and benefits discussed and monitors and equipment checked Spinal Block Patient position: sitting Prep: DuraPrep Patient monitoring: heart rate, cardiac monitor, continuous pulse ox and blood pressure Approach: midline Location: L3-4 Injection technique: single-shot Needle Needle type: Sprotte and Pencan  Needle gauge: 24 G Needle length: 9 cm Needle insertion depth: 6 cm Assessment Sensory level: T4

## 2013-04-27 NOTE — Op Note (Signed)
[name] PROCEDURE DATE: 04/22/2013 - 04/27/2013  PREOPERATIVE DIAGNOSES: Intrauterine pregnancy at  [redacted]w[redacted]d weeks gestation; PPROM, breech presentation, onset of labor, desire for permanent sterilization.   POSTOPERATIVE DIAGNOSES: The same  PROCEDURE: Repeat Low Transverse Cesarean Section  SURGEON:  Dr. Scheryl Darter  ASSISTANT:  Dr. Rulon Abide  ANESTHESIOLOGIST: Dr. Lilli Light  INDICATIONS: Tami Thomas is a 27 y.o. [id number] at [redacted]w[redacted]d here for cesarean section secondary to the indications listed under preoperative diagnoses; please see preoperative note for further details.  The risks of cesarean section were discussed with the patient including but were not limited to: bleeding which may require transfusion or reoperation; infection which may require antibiotics; injury to bowel, bladder, ureters or other surrounding organs; injury to the fetus; need for additional procedures including hysterectomy in the event of a life-threatening hemorrhage; placental abnormalities wth subsequent pregnancies, incisional problems, thromboembolic phenomenon and other postoperative/anesthesia complications.   The patient concurred with the proposed plan, giving informed written consent for the procedure.    Patient also desires permanent sterilization.  Other reversible forms of contraception were discussed with patient; she declines all other modalities. Risks of procedure discussed with patient including but not limited to: risk of regret, permanence of method, bleeding, infection, injury to surrounding organs and need for additional procedures.  Failure risk of 1-2% with increased risk of ectopic gestation if pregnancy occurs was also discussed with patient.  The patient concurred with the proposed plan, giving informed written consent for the procedures.     FINDINGS:  Viable female infant in breech presentation.  Apgars 8 and 9.  Clear amniotic fluid.  Intact placenta, three vessel cord.  Normal uterus,  fallopian tubes and ovaries bilaterally, sterilized with filshie clips.  ANESTHESIA: Spinal INTRAVENOUS FLUIDS: 1600 ml ESTIMATED BLOOD LOSS: 600 ml URINE OUTPUT:  1100 ml SPECIMENS: Placenta sent to pathology COMPLICATIONS: None immediate  PROCEDURE IN DETAIL:  The patient preoperatively received intravenous antibiotics and had sequential compression devices applied to her lower extremities.  She was then taken to the operating room where spinal anesthesia was administered and was found to be adequate. She was then placed in a dorsal supine position with a leftward tilt, and prepped and draped in a sterile manner.  A foley catheter was placed into her bladder and attached to constant gravity.  After an adequate timeout was performed, 30cc of 0.5% marcaine was injected and a pfannenstiel incision was made with scalpel and carried through to the underlying layer of fascia. The fascia was incised in the midline, and this incision was extended bilaterally using the Mayo scissors.  Kocher clamps were applied to the superior aspect of the fascial incision and the underlying rectus muscles were dissected off bluntly. Multiple adhesions were taken down using the bovie at the superior aspect of the fascial incision. A similar process was carried out on the inferior aspect of the fascial incision. The rectus muscles were separated in the midline bluntly and the peritoneum was entered using electrocaudery due to scarring with care taken to protect the underlying tissue. Attention was turned to the lower uterine segment where a bladder flap was created and a low transverse hysterotomy was made with a scalpel and extended bilaterally bluntly.  The infant was successfully delivered breech in the usual fashion, the cord was clamped and cut and the infant was handed over to awaiting neonatology team. Uterine massage was then administered, and the placenta delivered intact with a three-vessel cord. The uterus was then  cleared of clot and  debris.  The hysterotomy was closed with 0 Vicryl in a running locked fashion, and an imbricating layer was also placed with 0 Vicryl.  Attention was then turned to the fallopian tubes, about 3 cm from the cornua, with care given to incorporate the underlying mesosalpinx on both sides, allowing for bilateral tubal sterilization. The pelvis was cleared of all clot and debris. Hemostasis was confirmed on all surfaces.  The peritoneum and the muscles were reapproximated using 2-0 Vicryl running stitches. The fascia was then closed using 0 Vicryl in a running fashion.  The subcutaneous layer was irrigated.The skin was closed with a 4-0 Vicryl subcuticular stitch. The patient tolerated the procedure well. Sponge, lap, instrument and needle counts were correct x 2.  She was taken to the recovery room in stable condition.

## 2013-04-27 NOTE — Transfer of Care (Signed)
Immediate Anesthesia Transfer of Care Note  Patient: Tami Thomas  Procedure(s) Performed: Procedure(s): CESAREAN SECTION WITH BILATERAL TUBAL LIGATION (N/A)  Patient Location: PACU  Anesthesia Type:Spinal  Level of Consciousness: awake  Airway & Oxygen Therapy: Patient Spontanous Breathing  Post-op Assessment: Report given to PACU RN  Post vital signs: Reviewed and stable  Complications: No apparent anesthesia complications

## 2013-04-27 NOTE — Anesthesia Postprocedure Evaluation (Signed)
Anesthesia Post Note  Patient: Tami Thomas  Procedure(s) Performed: Procedure(s) (LRB): CESAREAN SECTION WITH BILATERAL TUBAL LIGATION (N/A)  Anesthesia type: Spinal  Patient location: PACU  Post pain: Pain level controlled  Post assessment: Post-op Vital signs reviewed  Last Vitals:  Filed Vitals:   04/27/13 1900  BP: 129/94  Pulse: 80  Temp:   Resp: 20    Post vital signs: Reviewed  Level of consciousness: awake  Complications: No apparent anesthesia complications

## 2013-04-27 NOTE — Anesthesia Preprocedure Evaluation (Signed)
Anesthesia Evaluation  Patient identified by MRN, date of birth, ID band Patient awake    Reviewed: Allergy & Precautions, H&P , NPO status , Patient's Chart, lab work & pertinent test results  Airway Mallampati: I TM Distance: >3 FB Neck ROM: full    Dental no notable dental hx.    Pulmonary neg pulmonary ROS,    Pulmonary exam normal       Cardiovascular negative cardio ROS      Neuro/Psych negative neurological ROS  negative psych ROS   GI/Hepatic negative GI ROS, Neg liver ROS,   Endo/Other  negative endocrine ROS  Renal/GU negative Renal ROS  negative genitourinary   Musculoskeletal negative musculoskeletal ROS (+)   Abdominal Normal abdominal exam  (+)   Peds  Hematology negative hematology ROS (+)   Anesthesia Other Findings   Reproductive/Obstetrics (+) Pregnancy                           Anesthesia Physical Anesthesia Plan  ASA: II and emergent  Anesthesia Plan: Spinal   Post-op Pain Management:    Induction:   Airway Management Planned:   Additional Equipment:   Intra-op Plan:   Post-operative Plan:   Informed Consent: I have reviewed the patients History and Physical, chart, labs and discussed the procedure including the risks, benefits and alternatives for the proposed anesthesia with the patient or authorized representative who has indicated his/her understanding and acceptance.     Plan Discussed with: CRNA and Surgeon  Anesthesia Plan Comments:         Anesthesia Quick Evaluation  

## 2013-04-27 NOTE — Progress Notes (Signed)
Patient ID: Tami Thomas, female   DOB: 03-17-1986, 27 y.o.   MRN: [id number] FACULTY PRACTICE ANTEPARTUM(COMPREHENSIVE) NOTE  Tami Thomas is a 27 y.o. [id number] at [redacted]w[redacted]d  who is admitted for PPROM.   Length of Stay:  5  Days  Subjective: No problems  Patient reports the fetal movement as active. Patient reports uterine contraction  activity as none. Patient reports  vaginal bleeding as none. Patient describes fluid per vagina as Clear.  Vitals:  Blood pressure 113/58, pulse 94, temperature 98.7 F (37.1 C), temperature source Oral, resp. rate 20, height 5\' 4"  (1.626 m), weight 119 lb 6.4 oz (54.159 kg), last menstrual period 08/29/2012, SpO2 99.00%. Physical Examination:  General appearance - alert, well appearing, and in no distress Abdomen - Abdomen:  Gravid, soft, non tender, non distended Extremities - no edema, redness or tenderness in the calves or thighs, Homan's sign negative bilaterally  Fetal Monitoring:  Baseline: 150 bpm, Variability: Good {> 6 bpm), Accelerations: Reactive and Decelerations: one decel lasting 2-3 minutes the last 2 NSTs  Labs:  No results found for this or any previous visit (from the past 24 hour(s)).   Medications:  Scheduled . amoxicillin  500 mg Oral Q8H  . docusate sodium  100 mg Oral Daily  . erythromycin  250 mg Oral Q6H  . prenatal multivitamin  1 tablet Oral Q1200   I have reviewed the patient's current medications.  ASSESSMENT: Patient Active Problem List   Diagnosis Date Noted  . Preterm premature rupture of membranes in third trimester 04/25/2013  . Insufficient prenatal care in third trimester 03/16/2013  . Previous cesarean section complicating pregnancy 03/16/2013  . History of preterm delivery, currently pregnant 10/09/2012    PLAN: Tami Thomas is a 27 y.o. [id number] at [redacted]w[redacted]d  who is admitted for PPROM.   1-Continue oral antibiotics 2-Will do prolonged NSTs today to eval tracings 3-Monitor for signs of  chorioamnionitis.  Tami Thomas H. 04/27/2013,6:52 AM

## 2013-04-28 ENCOUNTER — Encounter (HOSPITAL_COMMUNITY): Payer: Self-pay | Admitting: Obstetrics & Gynecology

## 2013-04-28 DIAGNOSIS — O34219 Maternal care for unspecified type scar from previous cesarean delivery: Secondary | ICD-10-CM

## 2013-04-28 DIAGNOSIS — O321XX0 Maternal care for breech presentation, not applicable or unspecified: Secondary | ICD-10-CM

## 2013-04-28 DIAGNOSIS — O4290 Premature rupture of membranes, unspecified as to length of time between rupture and onset of labor, unspecified weeks of gestation: Secondary | ICD-10-CM

## 2013-04-28 LAB — CBC
Hemoglobin: 9.3 g/dL — ABNORMAL LOW (ref 12.0–15.0)
MCH: 28.9 pg (ref 26.0–34.0)
MCV: 89.1 fL (ref 78.0–100.0)
Platelets: 151 10*3/uL (ref 150–400)
RBC: 3.22 MIL/uL — ABNORMAL LOW (ref 3.87–5.11)

## 2013-04-28 MED ORDER — FERROUS SULFATE 325 (65 FE) MG PO TABS
325.0000 mg | ORAL_TABLET | Freq: Every day | ORAL | Status: DC
  Administered 2013-04-29 – 2013-04-30 (×2): 325 mg via ORAL
  Filled 2013-04-28 (×2): qty 1

## 2013-04-28 NOTE — Progress Notes (Signed)
Attestation of Attending Supervision of Advanced Practitioner (CNM/NP): Evaluation and management procedures were performed by the Advanced Practitioner under my supervision and collaboration. I have reviewed the Advanced Practitioner's note and chart, and I agree with the management and plan.  LEGGETT,KELLY H. 11:33 AM   

## 2013-04-28 NOTE — Anesthesia Postprocedure Evaluation (Signed)
  Anesthesia Post-op Note  Patient: Tami Thomas  Procedure(s) Performed: Procedure(s): CESAREAN SECTION WITH BILATERAL TUBAL LIGATION (N/A)  Patient Location: PACU and Mother/Baby  Anesthesia Type:Spinal  Level of Consciousness: awake, alert  and oriented  Airway and Oxygen Therapy: Patient Spontanous Breathing  Post-op Pain: mild  Post-op Assessment: Patient's Cardiovascular Status Stable, Respiratory Function Stable, No signs of Nausea or vomiting and Pain level controlled  Post-op Vital Signs: stable  Complications: No apparent anesthesia complications Anesthesia Post-op Note  Patient: Tami Thomas  Procedure(s) Performed: Procedure(s): CESAREAN SECTION WITH BILATERAL TUBAL LIGATION (N/A)  Patient Location: PACU and Mother/Baby  Anesthesia Type:Spinal  Level of Consciousness: awake, alert  and oriented  Airway and Oxygen Therapy: Patient Spontanous Breathing  Post-op Pain: mild  Post-op Assessment: Patient's Cardiovascular Status Stable, Respiratory Function Stable, No signs of Nausea or vomiting and Pain level controlled  Post-op Vital Signs: stable  Complications: No apparent anesthesia complications

## 2013-04-28 NOTE — Progress Notes (Signed)
Subjective: Postpartum Day 1: Cesarean Delivery Patient reports tolerating PO.  Still has catheter in place. Not passing flatus yet.    Objective: Vital signs in last 24 hours: Temp:  [97.4 F (36.3 C)-98.6 F (37 C)] 98.3 F (36.8 C) (10/29 0500) Pulse Rate:  [58-101] 77 (10/29 0520) Resp:  [15-24] 20 (10/29 0520) BP: (83-135)/(45-98) 83/53 mmHg (10/29 0520) SpO2:  [95 %-100 %] 100 % (10/29 0520)  Physical Exam:  General: alert, cooperative and no distress Lochia: appropriate Uterine Fundus: firm Incision: pressure dressing in place, dry and intact.  DVT Evaluation: No evidence of DVT seen on physical exam. No significant calf/ankle edema.   Recent Labs  04/27/13 1615 04/28/13 0545  HGB 10.2* 9.3*  HCT 31.4* 28.7*    Assessment/Plan: Status post Cesarean section. Doing well postoperatively.  Continue current care. Foley out today  Start iron once daily S/p BTL   Darria Corvera L 04/28/2013, 7:57 AM

## 2013-04-29 ENCOUNTER — Encounter: Payer: Medicaid Other | Admitting: Obstetrics & Gynecology

## 2013-04-29 LAB — RPR: RPR Ser Ql: NONREACTIVE

## 2013-04-29 MED ORDER — TRAMADOL HCL 50 MG PO TABS
100.0000 mg | ORAL_TABLET | Freq: Once | ORAL | Status: AC
  Administered 2013-04-29: 100 mg via ORAL
  Filled 2013-04-29: qty 2

## 2013-04-29 NOTE — Progress Notes (Addendum)
Subjective: Postpartum Day 2: Cesarean Delivery Patient reports tolerating PO, + flatus and no problems voiding.  Doing much better this am, pain and bleeding improved  Objective: Vital signs in last 24 hours: Temp:  [98 F (36.7 C)-98.5 F (36.9 C)] 98 F (36.7 C) (10/30 0730) Pulse Rate:  [84-102] 89 (10/30 0730) Resp:  [18-20] 18 (10/30 0730) BP: (90-98)/(56-64) 90/57 mmHg (10/30 0730) SpO2:  [97 %-99 %] 97 % (10/30 0730)  Physical Exam:  General: alert, cooperative and no distress Lochia: appropriate Uterine Fundus: firm Incision: pressure dressing in place, clean, dry and intact.  DVT Evaluation: No evidence of DVT seen on physical exam. No significant calf/ankle edema.   Recent Labs  04/27/13 1615 04/28/13 0545  HGB 10.2* 9.3*  HCT 31.4* 28.7*    Assessment/Plan: Status post Cesarean section and BTL. Doing well postoperatively.  Continue current care. Plan for d/c tmw Cont Iron supplementation Bottle feeding but also attempting to pump Will fup in [facility] in 4-6weeks postpartum    Anselm Lis 04/29/2013, 7:44 AM  I have seen and examined this patient and agree with above documentation in the resident's note.   Rulon Abide, M.D. [facility] Fellow 04/29/2013 12:50 PM

## 2013-04-29 NOTE — Progress Notes (Signed)
UR chart review completed.  

## 2013-04-30 MED ORDER — FERROUS SULFATE 325 (65 FE) MG PO TABS: 325.0000 mg | ORAL_TABLET | Freq: Every day | ORAL | Status: DC

## 2013-04-30 MED ORDER — IBUPROFEN 600 MG PO TABS: 600.0000 mg | ORAL_TABLET | Freq: Four times a day (QID) | ORAL | Status: DC

## 2013-04-30 MED ORDER — OXYCODONE-ACETAMINOPHEN 5-325 MG PO TABS
1.0000 | ORAL_TABLET | ORAL | Status: DC | PRN
Start: 2013-04-30 — End: 2024-03-07

## 2013-04-30 NOTE — Discharge Summary (Signed)
Obstetric Discharge Summary Reason for Admission: PPROM Prenatal Procedures: NST and ultrasound Intrapartum Procedures: cesarean: low cervical, transverse and tubal ligation Postpartum Procedures: none Complications-Operative and Postpartum: none Hemoglobin  Date Value Range Status  04/28/2013 9.3* 12.0 - 15.0 g/dL Final     HCT  Date Value Range Status  04/28/2013 28.7* 36.0 - 46.0 % Final    Physical Exam:  General: alert, cooperative and no distress Lochia: appropriate Uterine Fundus: firm Incision: healing well, no significant drainage, no dehiscence, no significant erythema DVT Evaluation: No cords or calf tenderness. No significant calf/ankle edema.  Discharge Diagnoses: PPROM resulting in PTD @[redacted]w[redacted]d , desire for permanent sterilization, desire for repeat c/s  Discharge Information: Date: 04/30/2013 Activity: pelvic rest Diet: routine Medications: None Condition: stable Instructions: refer to practice specific booklet Discharge to: home Follow-up Information   Follow up with [facility]. Schedule an appointment as soon as possible for a visit in 4 weeks. (for post partum check)    Specialty:  Obstetrics and Gynecology   Contact information:   [address] [place] [address] [phone]      Newborn Data: Live born female  Birth Weight: 4 lb 5.1 oz (1959 g) APGAR: 8, 9  In NICU pending improved feeding.  Pt presented at [redacted]w[redacted]d with PPROM. She was admitted to antenatal and given BMZ x2, CP mag and latency antibiotics. On day 5 of hospitalization, she began having regular contractions and was found to be 7 cm dilated. She had a breech presentation fetus and hx of 2 prior c/s and desiring a third so she was taken for a LTCS on 10/28. No complications. Pt desired a BTL and it was performed with filshie clips at time of c-section.  Post op care has been uncomplicated. She is bottle feeding. Pt had no PNC during pregnancy but will f/u here at  [facility] hospital in clinic for her postpartum care. Baby to remain in NICU pending weight gain, improved feeding.   Tonilynn Bieker L 04/30/2013, 8:02 AM

## 2013-05-12 ENCOUNTER — Other Ambulatory Visit: Payer: Self-pay | Admitting: Obstetrics & Gynecology

## 2013-05-12 NOTE — Op Note (Signed)
Present for procedure and agree  Tate Zagal G Suad Autrey, MD   

## 2013-05-21 ENCOUNTER — Ambulatory Visit: Payer: Medicaid Other | Admitting: Obstetrics and Gynecology

## 2013-06-03 ENCOUNTER — Encounter: Payer: Self-pay | Admitting: *Deleted

## 2013-06-07 ENCOUNTER — Inpatient Hospital Stay (HOSPITAL_COMMUNITY)
Admission: RE | Admit: 2013-06-07 | Payer: Medicaid Other | Source: Ambulatory Visit | Admitting: Obstetrics & Gynecology

## 2013-06-07 ENCOUNTER — Encounter (HOSPITAL_COMMUNITY): Admission: RE | Payer: Self-pay | Source: Ambulatory Visit

## 2013-06-07 SURGERY — Surgical Case
Anesthesia: Regional | Laterality: Bilateral

## 2013-06-10 ENCOUNTER — Ambulatory Visit: Payer: Medicaid Other | Admitting: Obstetrics & Gynecology

## 2014-05-02 ENCOUNTER — Encounter (HOSPITAL_COMMUNITY): Payer: Self-pay | Admitting: Obstetrics & Gynecology

## 2016-02-05 ENCOUNTER — Ambulatory Visit: Payer: Medicaid Other | Admitting: Family Medicine

## 2016-06-06 ENCOUNTER — Ambulatory Visit: Payer: Medicaid Other | Admitting: Obstetrics & Gynecology

## 2016-06-18 ENCOUNTER — Emergency Department (HOSPITAL_COMMUNITY): Payer: Self-pay

## 2016-06-18 ENCOUNTER — Emergency Department (HOSPITAL_COMMUNITY)
Admission: EM | Admit: 2016-06-18 | Discharge: 2016-06-18 | Disposition: A | Discharge status: Home / Self Care | Payer: Self-pay | Attending: Emergency Medicine | Admitting: Emergency Medicine

## 2016-06-18 ENCOUNTER — Encounter (HOSPITAL_COMMUNITY): Payer: Self-pay

## 2016-06-18 DIAGNOSIS — R55 Syncope and collapse: Secondary | ICD-10-CM | POA: Insufficient documentation

## 2016-06-18 DIAGNOSIS — B349 Viral infection, unspecified: Secondary | ICD-10-CM | POA: Insufficient documentation

## 2016-06-18 DIAGNOSIS — E860 Dehydration: Secondary | ICD-10-CM | POA: Insufficient documentation

## 2016-06-18 LAB — CBC
HCT: 38.2 % (ref 36.0–46.0)
HEMOGLOBIN: 12.3 g/dL (ref 12.0–15.0)
MCH: 27.6 pg (ref 26.0–34.0)
MCHC: 32.2 g/dL (ref 30.0–36.0)
MCV: 85.7 fL (ref 78.0–100.0)
Platelets: 200 10*3/uL (ref 150–400)
RBC: 4.46 MIL/uL (ref 3.87–5.11)
RDW: 12.9 % (ref 11.5–15.5)
WBC: 15.3 10*3/uL — ABNORMAL HIGH (ref 4.0–10.5)

## 2016-06-18 LAB — D-DIMER, QUANTITATIVE: D-Dimer, Quant: 0.78 ug/mL-FEU — ABNORMAL HIGH (ref 0.00–0.50)

## 2016-06-18 LAB — BASIC METABOLIC PANEL
Anion gap: 10 (ref 5–15)
BUN: 7 mg/dL (ref 6–20)
CO2: 24 mmol/L (ref 22–32)
Calcium: 9.8 mg/dL (ref 8.9–10.3)
Chloride: 103 mmol/L (ref 101–111)
Creatinine, Ser: 0.75 mg/dL (ref 0.44–1.00)
GFR calc Af Amer: >60 mL/min (ref >60)
GFR calc non Af Amer: >60 mL/min (ref >60)
GLUCOSE: 93 mg/dL (ref 65–99)
Potassium: 3.6 mmol/L (ref 3.5–5.1)
Sodium: 137 mmol/L (ref 135–145)

## 2016-06-18 LAB — I-STAT BETA HCG BLOOD, ED (MC, WL, AP ONLY): I-stat hCG, quantitative: <5 m[IU]/mL (ref <5)

## 2016-06-18 MED ORDER — SODIUM CHLORIDE 0.9 % IV BOLUS (SEPSIS)
1000.0000 mL | Freq: Once | INTRAVENOUS | Status: AC
Start: 2016-06-18 — End: 2016-06-18
  Administered 2016-06-18: 1000 mL via INTRAVENOUS

## 2016-06-18 MED ORDER — IOPAMIDOL (ISOVUE-370) INJECTION 76%
INTRAVENOUS | Status: AC
  Administered 2016-06-18: 75 mL
  Filled 2016-06-18: qty 100

## 2016-06-18 MED ORDER — KETOROLAC TROMETHAMINE 15 MG/ML IJ SOLN
15.0000 mg | Freq: Once | INTRAMUSCULAR | Status: AC
  Administered 2016-06-18: 15 mg via INTRAVENOUS
  Filled 2016-06-18: qty 1

## 2016-06-18 MED ORDER — SODIUM CHLORIDE 0.9 % IV BOLUS (SEPSIS)
1000.0000 mL | Freq: Once | INTRAVENOUS | Status: AC
  Administered 2016-06-18: 1000 mL via INTRAVENOUS

## 2016-06-18 NOTE — ED Provider Notes (Signed)
By signing my name below, I, Tami Thomas, attest that this documentation has been prepared under the direction and in the presence of [name], MD. Electronically Signed: [name], ER Scribe. 02/10/2016. 5:35 PM.  HPI: Pt was transferred to fast track to be monitored. Her sx started yesterday with sore throat, bilateral ear pain and myalgias at work. Those sx are much improved today. Today when she stood up suddenly and got light headed with blurry vision and had a syncopal episode. She did not fall to the ground because she was caught by her grandmother. Later in the day she had another syncopal episode. She denies LOC or head injury. She did not have CP or SOB at any point. She has no PMHx of blood clots. She has a PMHx of anemia. She has been taking alkaseltzer.   Physical: Vitals normal other than slight tachycardia (108 bpm).  Heart tachycardic but regular, no m/r/g Lungs CTAB with no w/r/r Abdomen soft, NT, ND, with no rebound or guarding Ext: soft, wwp, no asymmetry or edema; no calf tenderness Neuro: CNII-XII intact, normal strength 5/5 in UE and LE b/l, normal sensation to light touch, normal gait, normal FTN and HTS  A/P: Primary suspicion is orthostatic syncope in setting of dehydration from viral URI, with classic prodrome. DDx vasovagal. Doubt ACS, arrhythmia and EKG shows normal intervals without arrhythmogenic abnormality. No focal neuro deficits to suggest intracranial abnormality such as CVA, TIA, or seizure. Will obtain screening labs, give IVF, and re-assess.  Labs show mild leukocytosis, likely reactive. Of note, D-Dimer elevated so CTA obtained. D-Dimer is pending at this time. Primary suspicion remains mild orthostasis 2/2 dehydration from viral illness, but will f/u CT with dispo accordingly. CT delayed 2/2 MVCs in department. Family is aware.   Shaune Pollackameron Renesme Kerrigan, MD 06/18/16 [phone]

## 2016-06-18 NOTE — ED Notes (Signed)
Pt to CT

## 2016-06-18 NOTE — ED Provider Notes (Signed)
[facility]-EMERGENCY DEPT Provider Note   CSN: [id number] Arrival date & time: 06/18/16  1617 By signing my name below, I, Bridgette HabermannMaria Tan, attest that this documentation has been prepared under the direction and in the presence of Kerrie BuffaloHope Neese, [place]. Electronically Signed: Bridgette HabermannMaria Tan, ED Scribe. 06/18/16. 5:07 PM.  History   Chief Complaint Chief Complaint  Patient presents with  . Sore Throat   HPI Comments: Tami Thomas is a 30 y.o. female with h/o anemia who presents to the Emergency Department complaining of multiple syncopal episodes occurring just PTA. Pt states she felt light-headed prior to her episode and feels light-headed at this time. Her episode lasts less than a minute. Pt reports she additionally has a sore throat, chills, bilateral ear pain, and generalized arthralgias/myalgias that began one day ago, that have since improved greatly. She notes that she has been taking AlcaSeltzer with significant relief. she correlates syncopal episodes possible to her anemia. She has not been further checked for her syncopal episodes considering the last time this happened was over ten years ago. Pt reports she's been around numerous people at work and at home who have been sick or had the flu. Pt does not regularly have a PCP she follows up with at this time. She denies fever or any other associated symptoms.   The history is provided by the patient. No language interpreter was used.    Past Medical History:  Diagnosis Date  . Anemia   . Chlamydia    2009  . History of preterm delivery, currently pregnant   . UTI (lower urinary tract infection)    with last pregnancy    Patient Active Problem List   Diagnosis Date Noted  . Preterm premature rupture of membranes in third trimester 04/25/2013  . Insufficient prenatal care in third trimester 03/16/2013  . Previous cesarean section complicating pregnancy 03/16/2013  . Preterm delivery 10/09/2012    Past Surgical History:  Procedure Laterality  Date  . CESAREAN SECTION  09/18/2007  . CESAREAN SECTION  09/11/2011   Procedure: CESAREAN SECTION;  Surgeon: Reva Boresanya S Pratt, MD;  Location: [facility] ORS;  Service: Gynecology;  Laterality: N/A;  Repeat Cesarean Section Delivery Baby  Boy @ [phone], Apgars 7/9  . CESAREAN SECTION WITH BILATERAL TUBAL LIGATION N/A 04/27/2013   Procedure: CESAREAN SECTION WITH BILATERAL TUBAL LIGATION;  Surgeon: Adam PhenixJames G Arnold, MD;  Location: [facility] ORS;  Service: Obstetrics;  Laterality: N/A;    OB History    Gravida Para Term Preterm AB Living   3 3 1 2  0 2   SAB TAB Ectopic Multiple Live Births   0 0 0 0 2       Home Medications    Prior to Admission medications   Medication Sig Start Date End Date Taking? Authorizing Provider  ferrous sulfate 325 (65 FE) MG tablet Take 1 tablet (325 mg total) by mouth daily with breakfast. 04/30/13   Vale HavenKeli L Beck, MD  ibuprofen (ADVIL,MOTRIN) 600 MG tablet Take 1 tablet (600 mg total) by mouth every 6 (six) hours. 04/30/13   Vale HavenKeli L Beck, MD  oxyCODONE-acetaminophen (PERCOCET/ROXICET) 5-325 MG per tablet Take 1-2 tablets by mouth every 4 (four) hours as needed. 04/30/13   Vale HavenKeli L Beck, MD  Prenatal Vit-Fe Fumarate-FA (PRENATAL MULTIVITAMIN) TABS tablet Take 1 tablet by mouth daily at 12 noon.    Historical Provider, MD    Family History Family History  Problem Relation Age of Onset  . Hypertension Maternal Grandmother     Social  History Social History  Substance Use Topics  . Smoking status: Never Smoker  . Smokeless tobacco: Never Used  . Alcohol use No     Allergies   Patient has no known allergies.   Review of Systems Review of Systems  Constitutional: Positive for chills. Negative for fever.  HENT: Positive for ear pain and sore throat.   Musculoskeletal: Positive for myalgias.  Neurological: Positive for dizziness, syncope and light-headedness.  All other systems reviewed and are negative.    Physical Exam Updated Vital Signs BP 113/79 (BP Location: Left  Arm)   Pulse 82   Temp 97.4 F (36.3 C) (Oral)   Resp 18   Ht 5\' 4"  (1.626 m)   Wt 53.1 kg   SpO2 100%   BMI 20.08 kg/m   Physical Exam  Constitutional: She appears well-developed and well-nourished.  HENT:  Head: Normocephalic.  Eyes: Conjunctivae are normal.  Neck: Neck supple.  Cardiovascular: Normal rate.   Pulmonary/Chest: Effort normal. No respiratory distress.  Abdominal: She exhibits no distension.  Musculoskeletal: Normal range of motion.  Neurological: She is alert.  Skin: Skin is warm and dry.  Psychiatric: She has a normal mood and affect. Her behavior is normal.  Nursing note and vitals reviewed.    ED Treatments / Results  DIAGNOSTIC STUDIES: Oxygen Saturation is 100% on RA, normal by my interpretation.    COORDINATION OF CARE: 5:07 PM Discussed treatment plan with pt at bedside which includes blood work and pt agreed to plan.  Labs (all labs ordered are listed, but only abnormal results are displayed) Labs Reviewed - No data to display  Radiology No results found.  Procedures Procedures (including critical care time)  Medications Ordered in ED Medications - No data to display   Initial Impression / Assessment and Plan / ED Course  I have reviewed the triage vital signs and the nursing notes.   Clinical Course     Final Clinical Impressions(s) / ED Diagnoses  Medical screening exam complete and patient is stable to await care from next provider.    [address]47 Augusta StreetHope EastonM Neese, NP 06/18/16 1725    Shaune Pollackameron Isaacs, MD 06/18/16 [phone]

## 2016-06-18 NOTE — ED Triage Notes (Signed)
Per Pt, Pt is coming from home with complaints of chills, general body aches, and sore throat that started yesterday. Reports she hasn't had much of an appetite and had some lightheadedness yesterday. Denies fever or congestion.

## 2016-06-18 NOTE — ED Notes (Signed)
Pt verbalized understanding of d/c instructions and has no further questions. Pt is stable, A&Ox4, VSS.  

## 2017-05-08 ENCOUNTER — Telehealth: Payer: Self-pay | Admitting: General Practice

## 2017-05-08 ENCOUNTER — Ambulatory Visit: Payer: Self-pay | Admitting: Obstetrics & Gynecology

## 2017-05-08 NOTE — Telephone Encounter (Signed)
Patient no showed for appt today. Called patient, no answer- left message stating we are trying to reach you as you missed your appt with us today. If you would like this rescheduled please call us back

## 2019-07-06 ENCOUNTER — Ambulatory Visit: Payer: Self-pay | Attending: Internal Medicine

## 2019-07-06 DIAGNOSIS — Z20822 Contact with and (suspected) exposure to covid-19: Secondary | ICD-10-CM

## 2019-07-08 LAB — NOVEL CORONAVIRUS, NAA: SARS-CoV-2, NAA: NOT DETECTED

## 2024-03-07 ENCOUNTER — Encounter (HOSPITAL_COMMUNITY): Payer: Self-pay

## 2024-03-07 ENCOUNTER — Ambulatory Visit (HOSPITAL_COMMUNITY)
Admission: EM | Admit: 2024-03-07 | Discharge: 2024-03-07 | Disposition: A | Discharge status: Home / Self Care | Payer: MEDICAID

## 2024-03-07 DIAGNOSIS — R0981 Nasal congestion: Secondary | ICD-10-CM

## 2024-03-07 DIAGNOSIS — R051 Acute cough: Secondary | ICD-10-CM

## 2024-03-07 DIAGNOSIS — U071 COVID-19: Secondary | ICD-10-CM

## 2024-03-07 LAB — POC SARS CORONAVIRUS 2 AG -  ED: SARS Coronavirus 2 Ag: POSITIVE — AB

## 2024-03-07 MED ORDER — PROMETHAZINE-DM 6.25-15 MG/5ML PO SYRP: 5.0000 mL | ORAL_SOLUTION | Freq: Two times a day (BID) | ORAL | 0 refills | Status: AC | PRN

## 2024-03-07 NOTE — Discharge Instructions (Signed)
 You tested positive for COVID-19.  You can return to work once your symptoms are improving and you have not had a fever for 24 hours without the use of medication.  I do recommend that you wear a mask for an additional 5 days once you do return to work to prevent spread to other people.  Take Promethazine  DM for cough.  This will make you sleepy do not drive or drink alcohol with taking it.  Use over-the-counter medication including Mucinex, Flonase, Tylenol , nasal saline/sinus rinses for additional symptom relief.  If you are not feeling better in a week or if anything worsens you need to return for reevaluation.

## 2024-03-07 NOTE — ED Triage Notes (Signed)
 Pt states that she has chills and body aches. X2 days

## 2024-03-07 NOTE — ED Provider Notes (Addendum)
 [facility]    CSN: [id number] Arrival date & time: 03/07/24  1404      History   Chief Complaint Chief Complaint  Patient presents with   Chills    Chills and body aches    HPI Tami Thomas is a 38 y.o. female.   Patient presents today with a 2-day history of URI symptoms.  She reports nasal congestion, mild cough, chills, body aches, headache.  Denies any chest pain, shortness of breath, nausea, vomiting, diarrhea, fever.  Denies any known sick contacts.  She did take an at home COVID test and believes that this was positive but had difficulty reading it so is requesting that we repeat it today.  She has missed work as a result of symptoms and is requesting a work excuse note.  She did have COVID several years ago with a similar presentation.  She has not had COVID vaccines.  She has tried Tylenol  with improvement of symptoms.  Denies any recent antibiotics or steroids.  She is confident that she is not pregnant.  Denies any significant past medical history including diabetes, asthma, COPD, smoking, malignancy, chronic liver/kidney disease.    Past Medical History:  Diagnosis Date   Anemia    Chlamydia    2009   History of preterm delivery, currently pregnant    UTI (lower urinary tract infection)    with last pregnancy    Patient Active Problem List   Diagnosis Date Noted   Preterm premature rupture of membranes in third trimester 04/25/2013   Insufficient prenatal care in third trimester 03/16/2013   Previous cesarean section complicating pregnancy 03/16/2013   Preterm delivery 10/09/2012    Past Surgical History:  Procedure Laterality Date   CESAREAN SECTION  09/18/2007   CESAREAN SECTION  09/11/2011   Procedure: CESAREAN SECTION;  Surgeon: Glenys GORMAN Birk, MD;  Location: [facility];  Service: Gynecology;  Laterality: N/A;  Repeat Cesarean Section Delivery Baby  Boy @ [phone], Apgars 7/9   CESAREAN SECTION WITH BILATERAL TUBAL LIGATION N/A 04/27/2013    Procedure: CESAREAN SECTION WITH BILATERAL TUBAL LIGATION;  Surgeon: Lynwood KANDICE Solomons, MD;  Location: [facility];  Service: Obstetrics;  Laterality: N/A;    OB History     Gravida  3   Para  3   Term  1   Preterm  2   AB  0   Living  2      SAB  0   IAB  0   Ectopic  0   Multiple  0   Live Births  2            Home Medications    Prior to Admission medications   Medication Sig Start Date End Date Taking? Authorizing Provider  Acetaminophen  (TYLENOL  EXTRA STRENGTH PO) Take by mouth.   Yes [provider]  promethazine -dextromethorphan (PROMETHAZINE -DM) 6.25-15 MG/5ML syrup Take 5 mLs by mouth 2 (two) times daily as needed for cough. 03/07/24  Yes Nyal Schachter K, PA-C  NIFEdipine  (PROCARDIA -XL/ADALAT  CC) 30 MG 24 hr tablet Take 2 tablets (60 mg total) by mouth 2 (two) times daily. 08/08/11 09/13/11  Stinson, Jacob J, DO    Family History Family History  Problem Relation Age of Onset   Hypertension Maternal Grandmother     Social History Social History   Tobacco Use   Smoking status: Never   Smokeless tobacco: Never  Vaping Use   Vaping status: Never Used  Substance Use Topics   Alcohol use:  No   Drug use: No     Allergies   Patient has no known allergies.   Review of Systems Review of Systems  Constitutional:  Positive for activity change and chills. Negative for appetite change, fatigue and fever.  HENT:  Positive for congestion. Negative for sinus pressure, sneezing and sore throat.   Respiratory:  Positive for cough. Negative for shortness of breath.   Cardiovascular:  Negative for chest pain.  Gastrointestinal:  Negative for abdominal pain, diarrhea, nausea and vomiting.  Musculoskeletal:  Positive for arthralgias and myalgias.  Neurological:  Positive for headaches. Negative for dizziness and light-headedness.     Physical Exam Triage Vital Signs ED Triage Vitals  Encounter Vitals Group     BP 03/07/24 1535 106/75     Girls  Systolic BP Percentile --      Girls Diastolic BP Percentile --      Boys Systolic BP Percentile --      Boys Diastolic BP Percentile --      Pulse Rate 03/07/24 1535 83     Resp 03/07/24 1535 18     Temp 03/07/24 1535 98.4 F (36.9 C)     Temp Source 03/07/24 1535 Oral     SpO2 03/07/24 1535 98 %     Weight 03/07/24 1534 100 lb (45.4 kg)     Height 03/07/24 1534 5' 4 (1.626 m)     Head Circumference --      Peak Flow --      Pain Score 03/07/24 1534 0     Pain Loc --      Pain Education --      Exclude from Growth Chart --    No data found.  Updated Vital Signs BP 106/75 (BP Location: Left Arm)   Pulse 83   Temp 98.4 F (36.9 C) (Oral)   Resp 18   Ht 5' 4 (1.626 m)   Wt 100 lb (45.4 kg)   LMP 02/13/2024   SpO2 98%   BMI 17.16 kg/m   Visual Acuity Right Eye Distance:   Left Eye Distance:   Bilateral Distance:    Right Eye Near:   Left Eye Near:    Bilateral Near:     Physical Exam Vitals reviewed.  Constitutional:      General: She is awake. She is not in acute distress.    Appearance: Normal appearance. She is well-developed. She is not ill-appearing.     Comments: Very pleasant female appears stated age in no acute distress sitting comfortably in exam room  HENT:     Head: Normocephalic and atraumatic.     Right Ear: Tympanic membrane, ear canal and external ear normal. Tympanic membrane is not erythematous or bulging.     Left Ear: Tympanic membrane, ear canal and external ear normal. Tympanic membrane is not erythematous or bulging.     Nose:     Right Sinus: No maxillary sinus tenderness or frontal sinus tenderness.     Left Sinus: No maxillary sinus tenderness or frontal sinus tenderness.     Mouth/Throat:     Pharynx: Uvula midline. Postnasal drip present. No oropharyngeal exudate or posterior oropharyngeal erythema.  Cardiovascular:     Rate and Rhythm: Normal rate and regular rhythm.     Heart sounds: Normal heart sounds, S1 normal and S2 normal.  No murmur heard. Pulmonary:     Effort: Pulmonary effort is normal.     Breath sounds: Normal breath sounds. No wheezing, rhonchi or  rales.     Comments: Clear to auscultation bilaterally Psychiatric:        Behavior: Behavior is cooperative.      UC Treatments / Results  Labs (all labs ordered are listed, but only abnormal results are displayed) Labs Reviewed  POC SARS CORONAVIRUS 2 AG -  ED - Abnormal; Notable for the following components:      Result Value   SARS Coronavirus 2 Ag Positive (*)    All other components within normal limits    EKG   Radiology No results found.  Procedures Procedures (including critical care time)  Medications Ordered in UC Medications - No data to display  Initial Impression / Assessment and Plan / UC Course  I have reviewed the triage vital signs and the nursing notes.  Pertinent labs & imaging results that were available during my care of the patient were reviewed by me and considered in my medical decision making (see chart for details).     Patient is well-appearing, afebrile, nontoxic, nontachycardic.  No indication for initiation of antibiotics as there is no evidence of acute infection on physical exam.  She did test positive for COVID-19 in clinic.  She is young and otherwise healthy and so would likely benefit minimally from Paxlovid.  We discussed risks and benefits and she elected not to initiate this medication.  Will treat symptomatically and she was given Promethazine  DM for cough.  We discussed that this can be sedating and she is not to drive or drink alcohol while taking it.  Recommend she use over-the-counter medication for symptom management.  If her symptoms are not improving within a week or if anything worsens she needs to be seen immediately.  Strict turn precautions given.  Excuse note provided.  Final Clinical Impressions(s) / UC Diagnoses   Final diagnoses:  Nasal congestion  COVID-19  Acute cough      Discharge Instructions      You tested positive for COVID-19.  You can return to work once your symptoms are improving and you have not had a fever for 24 hours without the use of medication.  I do recommend that you wear a mask for an additional 5 days once you do return to work to prevent spread to other people.  Take Promethazine  DM for cough.  This will make you sleepy do not drive or drink alcohol with taking it.  Use over-the-counter medication including Mucinex, Flonase, Tylenol , nasal saline/sinus rinses for additional symptom relief.  If you are not feeling better in a week or if anything worsens you need to return for reevaluation.     ED Prescriptions     Medication Sig Dispense Auth. Provider   promethazine -dextromethorphan (PROMETHAZINE -DM) 6.25-15 MG/5ML syrup Take 5 mLs by mouth 2 (two) times daily as needed for cough. 118 mL Elvena Oyer K, PA-C      PDMP not reviewed this encounter.   Sherrell Rocky POUR, PA-C 03/07/24 1557    Maxxon Schwanke, Rocky POUR, PA-C 03/07/24 1557
